# Patient Record
Sex: Male | Born: 2000 | Race: White | Hispanic: No | Marital: Single | Smoking: Never smoker
Health system: Southern US, Community
[De-identification: ages and names within clinical notes are randomized; demographics above are authoritative.]

## PROBLEM LIST (undated history)

## (undated) DIAGNOSIS — F909 Attention-deficit hyperactivity disorder, unspecified type: Secondary | ICD-10-CM

## (undated) DIAGNOSIS — T7840XA Allergy, unspecified, initial encounter: Secondary | ICD-10-CM

## (undated) DIAGNOSIS — F419 Anxiety disorder, unspecified: Secondary | ICD-10-CM

## (undated) HISTORY — DX: Attention-deficit hyperactivity disorder, unspecified type: F90.9

## (undated) HISTORY — DX: Anxiety disorder, unspecified: F41.9

## (undated) HISTORY — PX: APPENDECTOMY: SHX54

---

## 2021-02-01 ENCOUNTER — Encounter: Payer: Self-pay | Admitting: Emergency Medicine

## 2021-02-01 ENCOUNTER — Other Ambulatory Visit: Payer: Self-pay

## 2021-02-01 ENCOUNTER — Emergency Department: Payer: No Typology Code available for payment source

## 2021-02-01 ENCOUNTER — Emergency Department
Admission: EM | Admit: 2021-02-01 | Discharge: 2021-02-01 | Disposition: A | Discharge status: Home / Self Care | Payer: No Typology Code available for payment source | Attending: Emergency Medicine | Admitting: Emergency Medicine

## 2021-02-01 DIAGNOSIS — R079 Chest pain, unspecified: Secondary | ICD-10-CM | POA: Insufficient documentation

## 2021-02-01 DIAGNOSIS — H538 Other visual disturbances: Secondary | ICD-10-CM | POA: Diagnosis not present

## 2021-02-01 DIAGNOSIS — Y92410 Unspecified street and highway as the place of occurrence of the external cause: Secondary | ICD-10-CM | POA: Diagnosis not present

## 2021-02-01 DIAGNOSIS — S161XXA Strain of muscle, fascia and tendon at neck level, initial encounter: Secondary | ICD-10-CM | POA: Diagnosis not present

## 2021-02-01 DIAGNOSIS — S39012A Strain of muscle, fascia and tendon of lower back, initial encounter: Secondary | ICD-10-CM | POA: Insufficient documentation

## 2021-02-01 DIAGNOSIS — Y93I9 Activity, other involving external motion: Secondary | ICD-10-CM | POA: Insufficient documentation

## 2021-02-01 DIAGNOSIS — S299XXA Unspecified injury of thorax, initial encounter: Secondary | ICD-10-CM | POA: Diagnosis present

## 2021-02-01 DIAGNOSIS — R0781 Pleurodynia: Secondary | ICD-10-CM

## 2021-02-01 HISTORY — DX: Allergy, unspecified, initial encounter: T78.40XA

## 2021-02-01 MED ORDER — NAPROXEN 500 MG PO TABS: 500.0000 mg | ORAL_TABLET | Freq: Two times a day (BID) | ORAL | 0 refills | Status: DC
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
## 2021-02-01 NOTE — Discharge Instructions (Addendum)
Follow-up with your primary care provider or acute care if any continued problems.  Return to the emergency department if any severe worsening of your symptoms.  You can expect to be sore and stiff for approximately 4 to 5 days even with medication.  You may use ice or heat to your muscles as needed for discomfort.  Begin taking naproxen 500 mg twice daily with food.  This should help with soreness and stiffness without producing drowsiness.  The area on your face twice a day with mild soap and water and watch for any signs of infection.
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
## 2021-06-23 ENCOUNTER — Ambulatory Visit: Payer: Self-pay | Admitting: Nurse Practitioner

## 2022-04-05 ENCOUNTER — Ambulatory Visit: Payer: Self-pay | Admitting: Nurse Practitioner

## 2022-04-05 NOTE — Progress Notes (Deleted)
   There were no vitals taken for this visit.   Subjective:    Patient ID: Eugene Allen, male    DOB: [birth year], 21 y.o.   MRN: [id number]  HPI: Eugene Allen is a 21 y.o. male  No chief complaint on file.  Patient presents to clinic to establish care with new PCP.  Introduced to Publishing rights manager role and practice setting.  All questions answered.  Discussed provider/patient relationship and expectations.  Patient reports a history of ***. Patient denies a history of: Hypertension, Elevated Cholesterol, Diabetes, Thyroid problems, Depression, Anxiety, Neurological problems, and Abdominal problems.   Active Ambulatory Problems    Diagnosis Date Noted   No Active Ambulatory Problems   Resolved Ambulatory Problems    Diagnosis Date Noted   No Resolved Ambulatory Problems   Past Medical History:  Diagnosis Date   Allergies    Past Surgical History:  Procedure Laterality Date   APPENDECTOMY    No family history on file.   Review of Systems  Per HPI unless specifically indicated above     Objective:    There were no vitals taken for this visit.  Wt Readings from Last 3 Encounters:  02/01/21 184 lb (83.5 kg)    Physical Exam  No results found for this or any previous visit.    Assessment & Plan:   Problem List Items Addressed This Visit   None Visit Diagnoses     Encounter to establish care    -  Primary        Follow up plan: No follow-ups on file.

## 2022-04-05 NOTE — Patient Instructions (Signed)

## 2022-04-06 ENCOUNTER — Ambulatory Visit (INDEPENDENT_AMBULATORY_CARE_PROVIDER_SITE_OTHER): Payer: No Typology Code available for payment source | Admitting: Nurse Practitioner

## 2022-04-06 ENCOUNTER — Encounter: Payer: Self-pay | Admitting: Nurse Practitioner

## 2022-04-06 VITALS — BP 100/64 | HR 68 | Temp 97.9°F | Ht 67.5 in | Wt 188.8 lb

## 2022-04-06 DIAGNOSIS — Z7689 Persons encountering health services in other specified circumstances: Secondary | ICD-10-CM

## 2022-04-06 DIAGNOSIS — Z6829 Body mass index (BMI) 29.0-29.9, adult: Secondary | ICD-10-CM | POA: Insufficient documentation

## 2022-04-06 DIAGNOSIS — Z136 Encounter for screening for cardiovascular disorders: Secondary | ICD-10-CM

## 2022-04-06 DIAGNOSIS — Z13220 Encounter for screening for lipoid disorders: Secondary | ICD-10-CM | POA: Diagnosis not present

## 2022-04-06 DIAGNOSIS — F908 Attention-deficit hyperactivity disorder, other type: Secondary | ICD-10-CM | POA: Insufficient documentation

## 2022-04-06 DIAGNOSIS — R41840 Attention and concentration deficit: Secondary | ICD-10-CM | POA: Diagnosis not present

## 2022-04-06 NOTE — Assessment & Plan Note (Addendum)
Referral placed for ADD/ADHD testing per patient request.  Check labs outpatient: CBC, CMP, TSH.

## 2022-04-06 NOTE — Progress Notes (Signed)
New Patient Office Visit  Subjective    Patient ID: Eugene Allen, male    DOB: 15-May-2001  Age: 21 y.o. MRN: [id number]  CC:  Chief Complaint  Patient presents with   New Patient (Initial Visit)    Patient is here as a New Patient to Establish Care. Patient declines having any concerns at today's visit.     HPI Eugene Allen presents for new patient visit to establish care.  Introduced to Publishing rights manager role and practice setting.  All questions answered.  Discussed provider/patient relationship and expectations. Currently Landscape architect for [employer].   No current chronic health issues.  Had appendix removed a few years ago.  History of taking Accutane use in past.  Currently not sexually active, has not been.   He would like referral to get testing for ADHD -- [facility] preferred.  Struggled with concentration for years.      04/06/2022    4:00 PM  Depression screen PHQ 2/9  Decreased Interest 1  Down, Depressed, Hopeless 1  PHQ - 2 Score 2  Altered sleeping 2  Tired, decreased energy 1  Change in appetite 0  Feeling bad or failure about yourself  1  Trouble concentrating 2  Moving slowly or fidgety/restless 0  Suicidal thoughts 0  PHQ-9 Score 8  Difficult doing work/chores Somewhat difficult       04/06/2022    4:00 PM  GAD 7 : Generalized Anxiety Score  Nervous, Anxious, on Edge 0  Control/stop worrying 0  Worry too much - different things 3  Trouble relaxing 0  Restless 0  Easily annoyed or irritable 0  Afraid - awful might happen 0  Total GAD 7 Score 3  Anxiety Difficulty Not difficult at all     Outpatient Encounter Medications as of 04/06/2022  Medication Sig   [DISCONTINUED] naproxen (NAPROSYN) 500 MG tablet Take 1 tablet (500 mg total) by mouth 2 (two) times daily with a meal.   No facility-administered encounter medications on file as of 04/06/2022.    Past Medical History:  Diagnosis Date   Allergies     Past  Surgical History:  Procedure Laterality Date   APPENDECTOMY      Family History  Problem Relation Age of Onset   Hypertension Mother    Breast cancer Mother    Healthy Father     Social History   Socioeconomic History   Marital status: Single    Spouse name: Not on file   Number of children: Not on file   Years of education: Not on file   Highest education level: Not on file  Occupational History   Not on file  Tobacco Use   Smoking status: Never    Passive exposure: Never   Smokeless tobacco: Never  Substance and Sexual Activity   Alcohol use: Yes    Comment: occasional   Drug use: Never   Sexual activity: Not Currently  Other Topics Concern   Not on file  Social History Narrative   Not on file   Social Determinants of Health   Financial Resource Strain: Low Risk  (04/06/2022)   Overall Financial Resource Strain (CARDIA)    Difficulty of Paying Living Expenses: Not hard at all  Food Insecurity: No Food Insecurity (04/06/2022)   Hunger Vital Sign    Worried About Running Out of Food in the Last Year: Never true    Ran Out of Food in the Last Year: Never true  Transportation Needs: No Transportation Needs (04/06/2022)   PRAPARE - Administrator, Civil Service (Medical): No    Lack of Transportation (Non-Medical): No  Physical Activity: Insufficiently Active (04/06/2022)   Exercise Vital Sign    Days of Exercise per Week: 4 days    Minutes of Exercise per Session: 30 min  Stress: Stress Concern Present (04/06/2022)   [employer] of Occupational Health - Occupational Stress Questionnaire    Feeling of Stress : To some extent  Social Connections: Socially Isolated (04/06/2022)   Social Connection and Isolation Panel [NHANES]    Frequency of Communication with Friends and Family: More than three times a week    Frequency of Social Gatherings with Friends and Family: More than three times a week    Attends Religious Services: Never    Doctor, general practice or Organizations: No    Attends Banker Meetings: Never    Marital Status: Never married  Intimate Partner Violence: Not At Risk (04/06/2022)   Humiliation, Afraid, Rape, and Kick questionnaire    Fear of Current or Ex-Partner: No    Emotionally Abused: No    Physically Abused: No    Sexually Abused: No    Review of Systems  Constitutional:  Negative for chills, diaphoresis, fever and weight loss.  Respiratory:  Negative for cough, shortness of breath and wheezing.   Cardiovascular:  Negative for chest pain, palpitations, orthopnea and leg swelling.  Neurological: Negative.   Endo/Heme/Allergies: Negative.   Psychiatric/Behavioral: Negative.        Objective    BP 100/64   Pulse 68   Temp 97.9 F (36.6 C) (Oral)   Ht 5' 7.5" (1.715 m)   Wt 188 lb 12.8 oz (85.6 kg)   SpO2 98%   BMI 29.13 kg/m   Physical Exam Vitals and nursing note reviewed.  Constitutional:      General: He is awake. He is not in acute distress.    Appearance: He is well-developed. He is not ill-appearing or toxic-appearing.  HENT:     Head: Normocephalic and atraumatic.     Right Ear: Hearing, tympanic membrane, ear canal and external ear normal. No drainage.     Left Ear: Hearing, tympanic membrane, ear canal and external ear normal. No drainage.     Nose: Nose normal.     Mouth/Throat:     Mouth: Mucous membranes are moist.     Pharynx: Uvula midline.  Eyes:     General: Lids are normal.        Right eye: No discharge.        Left eye: No discharge.     Conjunctiva/sclera: Conjunctivae normal.     Pupils: Pupils are equal, round, and reactive to light.  Neck:     Thyroid: No thyromegaly.     Vascular: No carotid bruit.  Cardiovascular:     Rate and Rhythm: Normal rate and regular rhythm.     Heart sounds: Normal heart sounds, S1 normal and S2 normal. No murmur heard.    No gallop.  Pulmonary:     Effort: Pulmonary effort is normal. No accessory muscle usage or  respiratory distress.     Breath sounds: Normal breath sounds.  Abdominal:     General: Bowel sounds are normal.     Palpations: Abdomen is soft.  Musculoskeletal:        General: Normal range of motion.     Cervical back: Normal range of motion and neck  supple.     Right lower leg: No edema.     Left lower leg: No edema.  Lymphadenopathy:     Cervical: No cervical adenopathy.  Skin:    General: Skin is warm and dry.     Capillary Refill: Capillary refill takes less than 2 seconds.     Findings: No rash.  Neurological:     Mental Status: He is alert and oriented to person, place, and time.     Deep Tendon Reflexes: Reflexes are normal and symmetric.  Psychiatric:        Attention and Perception: Attention normal.        Mood and Affect: Mood normal.        Speech: Speech normal.        Behavior: Behavior normal. Behavior is cooperative.        Thought Content: Thought content normal.    Assessment & Plan:   Problem List Items Addressed This Visit       Other   BMI 29.0-29.9,adult    BMI 29.13.  Recommended eating smaller high protein, low fat meals more frequently and exercising 30 mins a day 5 times a week with a goal of 10-15lb weight loss in the next 3 months. Patient voiced their understanding and motivation to adhere to these recommendations.       Difficulty concentrating - Primary    Referral placed for ADD/ADHD testing per patient request.  Check labs outpatient: CBC, CMP, TSH.      Relevant Orders   CBC with Differential/Platelet   Comprehensive metabolic panel   TSH   Ambulatory referral to Psychiatry   Other Visit Diagnoses     Encounter for lipid screening for cardiovascular disease       Lipid panel outpatient/fasting.   Relevant Orders   Lipid Panel w/o Chol/HDL Ratio   Encounter to establish care       New patient to practice, discussed NP role and practice setting.       Return in about 1 year (around 04/07/2023) for Annual physical -- labs  outpatient in 2 weeks fasting in morning.   Marjie Skiff, NP

## 2022-04-06 NOTE — Assessment & Plan Note (Signed)
BMI 29.13.  Recommended eating smaller high protein, low fat meals more frequently and exercising 30 mins a day 5 times a week with a goal of 10-15lb weight loss in the next 3 months. Patient voiced their understanding and motivation to adhere to these recommendations.

## 2022-04-08 ENCOUNTER — Encounter: Payer: Self-pay | Admitting: Psychology
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
## 2022-06-04 ENCOUNTER — Ambulatory Visit: Payer: Self-pay | Admitting: Nurse Practitioner

## 2022-10-25 ENCOUNTER — Encounter: Payer: Medicaid Other | Attending: Psychology | Admitting: Psychology

## 2022-10-25 ENCOUNTER — Encounter: Payer: Self-pay | Admitting: Psychology

## 2022-10-25 DIAGNOSIS — R41840 Attention and concentration deficit: Secondary | ICD-10-CM | POA: Diagnosis not present

## 2022-10-25 DIAGNOSIS — Z419 Encounter for procedure for purposes other than remedying health state, unspecified: Secondary | ICD-10-CM | POA: Diagnosis not present
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
## 2022-11-02 DIAGNOSIS — R41840 Attention and concentration deficit: Secondary | ICD-10-CM

## 2022-11-02 NOTE — Progress Notes (Signed)
   Behavioral Observations: The patient appeared well-groomed and appropriately dressed. His manners were polite and appropriate to the situation. The patient's attitude towards testing was positive and his effort was good.    Neuropsychology Note  Launa Grill completed 75 minutes of neuropsychological testing with technician, Staci Acosta, BA, under the supervision of Arley Phenix, PsyD., Clinical Neuropsychologist. The patient did not appear overtly distressed by the testing session, per behavioral observation or via self-report to the technician. Rest breaks were offered.   Clinical Decision Making: In considering the patient's current level of functioning, level of presumed impairment, nature of symptoms, emotional and behavioral responses during clinical interview, level of literacy, and observed level of motivation/effort, a battery of tests was selected by Dr. Kieth Brightly during initial consultation on 10/25/2022. This was communicated to the technician. Communication between the neuropsychologist and technician was ongoing throughout the testing session and changes were made as deemed necessary based on patient performance on testing, technician observations and additional pertinent factors such as those listed above.  Tests Administered: Comprehensive Attention Battery (CAB) Continuous Performance Test (CPT)  Results: Will be included in final report   Feedback to Patient: Eugene Allen will return on 07/28/2023 for an interactive feedback session with Dr. Kieth Brightly at which time his test performances, clinical impressions and treatment recommendations will be reviewed in detail. The patient understands he can contact our office should he require our assistance before this time.  75 minutes spent face-to-face with patient administering standardized tests, 30 minutes spent scoring Radiographer, therapeutic). [CPT [id number], [id number]]  Full report to follow.

## 2022-11-24 DIAGNOSIS — Z419 Encounter for procedure for purposes other than remedying health state, unspecified: Secondary | ICD-10-CM | POA: Diagnosis not present

## 2022-12-25 DIAGNOSIS — Z419 Encounter for procedure for purposes other than remedying health state, unspecified: Secondary | ICD-10-CM | POA: Diagnosis not present

## 2023-01-24 DIAGNOSIS — Z419 Encounter for procedure for purposes other than remedying health state, unspecified: Secondary | ICD-10-CM | POA: Diagnosis not present

## 2023-02-24 DIAGNOSIS — Z419 Encounter for procedure for purposes other than remedying health state, unspecified: Secondary | ICD-10-CM | POA: Diagnosis not present

## 2023-03-27 DIAGNOSIS — Z419 Encounter for procedure for purposes other than remedying health state, unspecified: Secondary | ICD-10-CM | POA: Diagnosis not present

## 2023-04-26 DIAGNOSIS — Z419 Encounter for procedure for purposes other than remedying health state, unspecified: Secondary | ICD-10-CM | POA: Diagnosis not present

## 2023-05-27 DIAGNOSIS — Z419 Encounter for procedure for purposes other than remedying health state, unspecified: Secondary | ICD-10-CM | POA: Diagnosis not present

## 2023-06-26 DIAGNOSIS — Z419 Encounter for procedure for purposes other than remedying health state, unspecified: Secondary | ICD-10-CM | POA: Diagnosis not present

## 2023-07-06 ENCOUNTER — Encounter: Payer: Medicaid Other | Attending: Psychology | Admitting: Psychology

## 2023-07-06 ENCOUNTER — Encounter: Payer: Self-pay | Admitting: Psychology

## 2023-07-06 DIAGNOSIS — F908 Attention-deficit hyperactivity disorder, other type: Secondary | ICD-10-CM | POA: Diagnosis not present
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
## 2023-07-27 DIAGNOSIS — Z419 Encounter for procedure for purposes other than remedying health state, unspecified: Secondary | ICD-10-CM | POA: Diagnosis not present

## 2023-07-28 ENCOUNTER — Encounter: Payer: Self-pay | Admitting: Psychology

## 2023-07-28 ENCOUNTER — Encounter: Payer: Medicaid Other | Attending: Psychology | Admitting: Psychology

## 2023-07-28 DIAGNOSIS — F908 Attention-deficit hyperactivity disorder, other type: Secondary | ICD-10-CM
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
## 2023-07-30 NOTE — Patient Instructions (Signed)
 Healthy Eating, Adult Healthy eating may help you get and keep a healthy body weight, reduce the risk of chronic disease, and live a long and productive life. It is important to follow a healthy eating pattern. Your nutritional and calorie needs should be met mainly by different nutrient-rich foods. What are tips for following this plan? Reading food labels Read labels and choose the following: Reduced or low sodium products. Juices with 100% fruit juice. Foods with low saturated fats (<3 g per serving) and high polyunsaturated and monounsaturated fats. Foods with whole grains, such as whole wheat, cracked wheat, brown rice, and wild rice. Whole grains that are fortified with folic acid . This is recommended for females who are pregnant or who want to become pregnant. Read labels and do not eat or drink the following: Foods or drinks with added sugars. These include foods that contain brown sugar, corn sweetener, corn syrup, dextrose , fructose, glucose, high-fructose corn syrup, honey, invert sugar, lactose, malt syrup, maltose, molasses, raw sugar, sucrose, trehalose, or turbinado sugar. Limit your intake of added sugars to less than 10% of your total daily calories. Do not eat more than the following amounts of added sugar per day: 6 teaspoons (25 g) for females. 9 teaspoons (38 g) for males. Foods that contain processed or refined starches and grains. Refined grain products, such as white flour, degermed cornmeal, white bread, and white rice. Shopping Choose nutrient-rich snacks, such as vegetables, whole fruits, and nuts. Avoid high-calorie and high-sugar snacks, such as potato chips, fruit snacks, and candy. Use oil-based dressings and spreads on foods instead of solid fats such as butter, margarine, sour cream, or cream cheese. Limit pre-made sauces, mixes, and instant products such as flavored rice, instant noodles, and ready-made pasta. Try more plant-protein sources, such as tofu,  tempeh, black beans, edamame, lentils, nuts, and seeds. Explore eating plans such as the Mediterranean diet or vegetarian diet. Try heart-healthy dips made with beans and healthy fats like hummus and guacamole. Vegetables go great with these. Cooking Use oil to saut or stir-fry foods instead of solid fats such as butter, margarine, or lard. Try baking, boiling, grilling, or broiling instead of frying. Remove the fatty part of meats before cooking. Steam vegetables in water  or broth. Meal planning  At meals, imagine dividing your plate into fourths: One-half of your plate is fruits and vegetables. One-fourth of your plate is whole grains. One-fourth of your plate is protein, especially lean meats, poultry, eggs, tofu, beans, or nuts. Include low-fat dairy as part of your daily diet. Lifestyle Choose healthy options in all settings, including home, work, school, restaurants, or stores. Prepare your food safely: Wash your hands after handling raw meats. Where you prepare food, keep surfaces clean by regularly washing with hot, soapy water . Keep raw meats separate from ready-to-eat foods, such as fruits and vegetables. Cook seafood, meat, poultry, and eggs to the recommended temperature. Get a food thermometer. Store foods at safe temperatures. In general: Keep cold foods at 34F (4.4C) or below. Keep hot foods at 134F (60C) or above. Keep your freezer at [facility] (-17.8C) or below. Foods are not safe to eat if they have been between the temperatures of 40-134F (4.4-60C) for more than 2 hours. What foods should I eat? Fruits Aim to eat 1-2 cups of fresh, canned (in natural juice), or frozen fruits each day. One cup of fruit equals 1 small apple, 1 large banana, 8 large strawberries, 1 cup (237 g) canned fruit,  cup (82 g) dried fruit,  or 1 cup (240 mL) 100% juice. Vegetables Aim to eat 2-4 cups of fresh and frozen vegetables each day, including different varieties and colors. One cup  of vegetables equals 1 cup (91 g) broccoli or cauliflower florets, 2 medium carrots, 2 cups (150 g) raw, leafy greens, 1 large tomato, 1 large bell pepper, 1 large sweet potato, or 1 medium white potato. Grains Aim to eat 5-10 ounce-equivalents of whole grains each day. Examples of 1 ounce-equivalent of grains include 1 slice of bread, 1 cup (40 g) ready-to-eat cereal, 3 cups (24 g) popcorn, or  cup (93 g) cooked rice. Meats and other proteins Try to eat 5-7 ounce-equivalents of protein each day. Examples of 1 ounce-equivalent of protein include 1 egg,  oz nuts (12 almonds, 24 pistachios, or 7 walnut halves), 1/4 cup (90 g) cooked beans, 6 tablespoons (90 g) hummus or 1 tablespoon (16 g) peanut butter. A cut of meat or fish that is the size of a deck of cards is about 3-4 ounce-equivalents (85 g). Of the protein you eat each week, try to have at least 8 sounce (227 g) of seafood. This is about 2 servings per week. This includes salmon, trout, herring, sardines, and anchovies. Dairy Aim to eat 3 cup-equivalents of fat-free or low-fat dairy each day. Examples of 1 cup-equivalent of dairy include 1 cup (240 mL) milk, 8 ounces (250 g) yogurt, 1 ounces (44 g) natural cheese, or 1 cup (240 mL) fortified soy milk. Fats and oils Aim for about 5 teaspoons (21 g) of fats and oils per day. Choose monounsaturated fats, such as canola and olive oils, mayonnaise made with olive oil or avocado oil, avocados, peanut butter, and most nuts, or polyunsaturated fats, such as sunflower, corn, and soybean oils, walnuts, pine nuts, sesame seeds, sunflower seeds, and flaxseed. Beverages Aim for 6 eight-ounce glasses of water  per day. Limit coffee to 3-5 eight-ounce cups per day. Limit caffeinated beverages that have added calories, such as soda and energy drinks. If you drink alcohol: Limit how much you have to: 0-1 drink a day if you are male. 0-2 drinks a day if you are male. Know how much alcohol is in your drink.  In the [place] one drink is one 12 oz bottle of beer (355 mL), one 5 oz glass of wine (148 mL), or one 1 oz glass of hard liquor (44 mL). Seasoning and other foods Try not to add too much salt to your food. Try using herbs and spices instead of salt. Try not to add sugar to food. This information is based on [place] nutrition guidelines. To learn more, visit [employer]. Exact amounts may vary. You may need different amounts. This information is not intended to replace advice given to you by your health care provider. Make sure you discuss any questions you have with your health care provider. Document Revised: 04/12/2022 Document Reviewed: 04/12/2022 Elsevier Patient Education  2024 [employer].

## 2023-08-05 ENCOUNTER — Ambulatory Visit (INDEPENDENT_AMBULATORY_CARE_PROVIDER_SITE_OTHER): Payer: Medicaid Other | Admitting: Nurse Practitioner

## 2023-08-05 VITALS — BP 105/70 | HR 78 | Temp 97.5°F | Ht 67.0 in | Wt 206.4 lb

## 2023-08-05 DIAGNOSIS — Z79899 Other long term (current) drug therapy: Secondary | ICD-10-CM | POA: Insufficient documentation

## 2023-08-05 DIAGNOSIS — F908 Attention-deficit hyperactivity disorder, other type: Secondary | ICD-10-CM

## 2023-08-05 MED ORDER — AMPHETAMINE-DEXTROAMPHET ER 10 MG PO CP24: 10.0000 mg | ORAL_CAPSULE | Freq: Every day | ORAL | 0 refills | Status: DC

## 2023-08-05 NOTE — Assessment & Plan Note (Signed)
 Adderall -- controlled substance agreement and UDS today.

## 2023-08-05 NOTE — Progress Notes (Signed)
 BP 105/70   Pulse 78   Temp (!) 97.5 F (36.4 C) (Oral)   Ht 5' 7 (1.702 m)   Wt 206 lb 6.4 oz (93.6 kg)   SpO2 98%   BMI 32.33 kg/m    Subjective:    Patient ID: Eugene Allen, male    DOB: 11/07/2000, 23 y.o.   MRN: [id number]  HPI: Eugene Allen is a 23 y.o. male  Chief Complaint  Patient presents with   ADHD    Patient states he was recently diagnosed with ADHD by psychiatry, states he would like to discuss starting treatment.    ADHD FOLLOW UP Saw psychology 07/06/23 and 07/28/23, has been following with them.  Started in April 2024.  Recently diagnosed with adult residual attention deficit disorder.  He does notice his symptoms start more mid morning and last all day.  Has had symptoms since childhood. ADHD status: new diagnosis Controlled substance contract:  obtained today Previous psychiatry evaluation: yes psychology Previous medications: no  Work/school performance:  fair Difficulty sustaining attention/completing tasks: yes Distracted by extraneous stimuli: yes Does not listen when spoken to: yes  Fidgets with hands or feet: yes Unable to stay in seat: no Blurts out/interrupts others: no ADHD Medication Side Effects: no current medication    Decreased appetite: no    Headache: no    Sleeping disturbance pattern: no    Irritability: no    Rebound effects (worse than baseline) off medication: no    Anxiousness: no    Dizziness: no    Tics: no    08/05/2023   10:24 AM 04/06/2022    4:00 PM  Depression screen PHQ 2/9  Decreased Interest 0 1  Down, Depressed, Hopeless 1 1  PHQ - 2 Score 1 2  Altered sleeping 2 2  Tired, decreased energy 2 1  Change in appetite 2 0  Feeling bad or failure about yourself  1 1  Trouble concentrating 2 2  Moving slowly or fidgety/restless 0 0  Suicidal thoughts 0 0  PHQ-9 Score 10 8  Difficult doing work/chores Somewhat difficult Somewhat difficult       08/05/2023   10:24 AM 04/06/2022    4:00 PM  GAD 7 :  Generalized Anxiety Score  Nervous, Anxious, on Edge 1 0  Control/stop worrying 1 0  Worry too much - different things 1 3  Trouble relaxing 0 0  Restless 0 0  Easily annoyed or irritable 0 0  Afraid - awful might happen 0 0  Total GAD 7 Score 3 3  Anxiety Difficulty Somewhat difficult Not difficult at all    Relevant past medical, surgical, family and social history reviewed and updated as indicated. Interim medical history since our last visit reviewed. Allergies and medications reviewed and updated.  Review of Systems  Constitutional:  Negative for activity change, diaphoresis, fatigue and fever.  Respiratory:  Negative for cough, chest tightness, shortness of breath and wheezing.   Cardiovascular:  Negative for chest pain, palpitations and leg swelling.  Gastrointestinal: Negative.   Neurological: Negative.   Psychiatric/Behavioral:  Positive for decreased concentration. Negative for self-injury, sleep disturbance and suicidal ideas. The patient is nervous/anxious.    Per HPI unless specifically indicated above     Objective:    BP 105/70   Pulse 78   Temp (!) 97.5 F (36.4 C) (Oral)   Ht 5' 7 (1.702 m)   Wt 206 lb 6.4 oz (93.6 kg)   SpO2 98%   BMI 32.33  kg/m   Wt Readings from Last 3 Encounters:  08/05/23 206 lb 6.4 oz (93.6 kg)  04/06/22 188 lb 12.8 oz (85.6 kg)  02/01/21 184 lb (83.5 kg)    Physical Exam Vitals and nursing note reviewed.  Constitutional:      General: He is awake. He is not in acute distress.    Appearance: He is well-developed and well-groomed. He is obese. He is not ill-appearing or toxic-appearing.  HENT:     Head: Normocephalic.     Right Ear: Hearing and external ear normal.     Left Ear: Hearing and external ear normal.  Eyes:     General: Lids are normal.     Extraocular Movements: Extraocular movements intact.     Conjunctiva/sclera: Conjunctivae normal.  Neck:     Thyroid : No thyromegaly.     Vascular: No carotid bruit.   Cardiovascular:     Rate and Rhythm: Normal rate and regular rhythm.     Heart sounds: Normal heart sounds. No murmur heard.    No gallop.  Pulmonary:     Effort: No accessory muscle usage or respiratory distress.     Breath sounds: Normal breath sounds.  Abdominal:     General: Bowel sounds are normal. There is no distension.     Palpations: Abdomen is soft.     Tenderness: There is no abdominal tenderness.  Musculoskeletal:     Cervical back: Full passive range of motion without pain.     Right lower leg: No edema.     Left lower leg: No edema.  Lymphadenopathy:     Cervical: No cervical adenopathy.  Skin:    General: Skin is warm.     Capillary Refill: Capillary refill takes less than 2 seconds.  Neurological:     Mental Status: He is alert and oriented to person, place, and time.     Deep Tendon Reflexes: Reflexes are normal and symmetric.     Reflex Scores:      Brachioradialis reflexes are 2+ on the right side and 2+ on the left side.      Patellar reflexes are 2+ on the right side and 2+ on the left side. Psychiatric:        Attention and Perception: Attention normal.        Mood and Affect: Mood normal.        Speech: Speech normal.        Behavior: Behavior normal. Behavior is cooperative.        Thought Content: Thought content normal.    No results found for this or any previous visit.    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 Discussed with him various options for treatment, including no treatment, Wellbutrin, or stimulant family like Ritalin/Adderall/Vyvanse.  Educated him on all medications and side effects.  We discussed office rules for controlled substances: every 3 month visits, UDS annually, controlled substance agreement.  He is agreeable to these.  UDS today and controlled substance agreement  signed.  Will start out with trial of Adderall XL 10 MG since symptoms often last throughout day and start later in morning.  Educated on this.  Plan for return in 30 days.      Controlled substance agreement signed   Signed on 08/05/2023 with patient and reviewed.  High risk medication use   Adderall -- controlled substance agreement and UDS today.      Relevant Orders   [id number] 11+Oxyco+Alc+Crt-Bund     Follow up plan: Return in about 1 month (around 09/05/2023) for ADHD -- started Adderall XL.

## 2023-08-05 NOTE — Assessment & Plan Note (Signed)
 New diagnosis by psychology January 2025. Discussed with him various options for treatment, including no treatment, Wellbutrin, or stimulant family like Ritalin/Adderall/Vyvanse.  Educated him on all medications and side effects.  We discussed office rules for controlled substances: every 3 month visits, UDS annually, controlled substance agreement.  He is agreeable to these.  UDS today and controlled substance agreement signed.  Will start out with trial of Adderall XL 10 MG since symptoms often last throughout day and start later in morning.  Educated on this.  Plan for return in 30 days.

## 2023-08-05 NOTE — Assessment & Plan Note (Signed)
 Signed on 08/05/2023 with patient and reviewed.

## 2023-08-08 LAB — DRUG SCREEN 764883 11+OXYCO+ALC+CRT-BUND
Amphetamines, Urine: NEGATIVE ng/mL
BENZODIAZ UR QL: NEGATIVE ng/mL
Barbiturate: NEGATIVE ng/mL
Cannabinoid Quant, Ur: NEGATIVE ng/mL
Cocaine (Metabolite): NEGATIVE ng/mL
Creatinine: 227.5 mg/dL (ref 20.0–300.0)
Ethanol: NEGATIVE %
Meperidine: NEGATIVE ng/mL
Methadone Screen, Urine: NEGATIVE ng/mL
OPIATE SCREEN URINE: NEGATIVE ng/mL
Oxycodone/Oxymorphone, Urine: NEGATIVE ng/mL
Phencyclidine: NEGATIVE ng/mL
Propoxyphene: NEGATIVE ng/mL
Tramadol: NEGATIVE ng/mL
pH, Urine: 5.6 (ref 4.5–8.9)

## 2023-08-17 ENCOUNTER — Ambulatory Visit: Payer: No Typology Code available for payment source | Admitting: Nurse Practitioner

## 2023-08-27 DIAGNOSIS — Z419 Encounter for procedure for purposes other than remedying health state, unspecified: Secondary | ICD-10-CM | POA: Diagnosis not present

## 2023-09-03 NOTE — Patient Instructions (Signed)

## 2023-09-05 ENCOUNTER — Encounter: Payer: Self-pay | Admitting: Nurse Practitioner

## 2023-09-05 ENCOUNTER — Telehealth: Payer: Self-pay | Admitting: Nurse Practitioner

## 2023-09-05 DIAGNOSIS — F908 Attention-deficit hyperactivity disorder, other type: Secondary | ICD-10-CM

## 2023-09-05 MED ORDER — AMPHETAMINE-DEXTROAMPHET ER 15 MG PO CP24: 15.0000 mg | ORAL_CAPSULE | ORAL | 0 refills | Status: DC

## 2023-09-05 NOTE — Assessment & Plan Note (Signed)
 Diagnosed by psychology January 2025. Is tolerating Adderall and seeing some difference.  Will increase to 15 MG XL dosing daily.  Educated him on medication and side effects.  We discussed office rules for controlled substances: every 3 month visits, UDS annually, controlled substance agreement.  He is agreeable to these.  UDS due next 08/04/24 and controlled substance agreement signed.  Plan for return in 30 days.

## 2023-09-05 NOTE — Progress Notes (Signed)
 Appointment has been made

## 2023-09-05 NOTE — Progress Notes (Signed)
 There were no vitals taken for this visit.   Subjective:    Patient ID: Eugene Allen, male    DOB: [birth year], 23 y.o.   MRN: [id number]  HPI: Eugene Allen is a 23 y.o. male  Chief Complaint  Patient presents with   ADHD   Virtual Visit via Video Note  I connected with Eugene Allen on 09/05/23 at 10:20 AM EST by a video enabled telemedicine application and verified that I am speaking with the correct person using two identifiers.  Location: Patient: home Provider: work   I discussed the limitations of evaluation and management by telemedicine and the availability of in person appointments. The patient expressed understanding and agreed to proceed.  I discussed the assessment and treatment plan with the patient. The patient was provided an opportunity to ask questions and all were answered. The patient agreed with the plan and demonstrated an understanding of the instructions.   The patient was advised to call back or seek an in-person evaluation if the symptoms worsen or if the condition fails to improve as anticipated.  I provided 25 minutes of non-face-to-face time during this encounter.   Yarelis Ambrosino T Tomica Arseneault, NP   ADHD FOLLOW UP Saw psychology 07/06/23 and 07/28/23, had been following with them.  They diagnosed him with adult residual attention deficit disorder.  We started him on Adderall XR 10 MG on 08/05/23, which he reports is making a difference, but does wear off later in the day.  Does notice improved focus and listening.  Denies ADR.  Has had symptoms since childhood. ADHD status: improving Controlled substance contract: Up To Date Previous psychiatry evaluation: yes psychology Previous medications: no  Work/school performance:  fair Difficulty sustaining attention/completing tasks: no, improved Distracted by extraneous stimuli: no, improved Does not listen when spoken to: no, improved Fidgets with hands or feet: yes Unable to stay in seat: no Blurts  out/interrupts others: no ADHD Medication Side Effects:     Decreased appetite: yes, mild    Headache: no    Sleeping disturbance pattern: no    Irritability: no    Rebound effects (worse than baseline) off medication: no    Anxiousness: no    Dizziness: no    Tics: no    08/05/2023   10:24 AM 04/06/2022    4:00 PM  Depression screen PHQ 2/9  Decreased Interest 0 1  Down, Depressed, Hopeless 1 1  PHQ - 2 Score 1 2  Altered sleeping 2 2  Tired, decreased energy 2 1  Change in appetite 2 0  Feeling bad or failure about yourself  1 1  Trouble concentrating 2 2  Moving slowly or fidgety/restless 0 0  Suicidal thoughts 0 0  PHQ-9 Score 10 8  Difficult doing work/chores Somewhat difficult Somewhat difficult       08/05/2023   10:24 AM 04/06/2022    4:00 PM  GAD 7 : Generalized Anxiety Score  Nervous, Anxious, on Edge 1 0  Control/stop worrying 1 0  Worry too much - different things 1 3  Trouble relaxing 0 0  Restless 0 0  Easily annoyed or irritable 0 0  Afraid - awful might happen 0 0  Total GAD 7 Score 3 3  Anxiety Difficulty Somewhat difficult Not difficult at all    Relevant past medical, surgical, family and social history reviewed and updated as indicated. Interim medical history since our last visit reviewed. Allergies and medications reviewed and updated.  Review of Systems  Constitutional:  Negative  for activity change, diaphoresis, fatigue and fever.  Respiratory:  Negative for cough, chest tightness, shortness of breath and wheezing.   Cardiovascular:  Negative for chest pain, palpitations and leg swelling.  Gastrointestinal: Negative.   Neurological: Negative.   Psychiatric/Behavioral:  Positive for decreased concentration. Negative for self-injury, sleep disturbance and suicidal ideas. The patient is nervous/anxious.    Per HPI unless specifically indicated above     Objective:    There were no vitals taken for this visit.  Wt Readings from Last 3  Encounters:  08/05/23 206 lb 6.4 oz (93.6 kg)  04/06/22 188 lb 12.8 oz (85.6 kg)  02/01/21 184 lb (83.5 kg)    Physical Exam Vitals and nursing note reviewed.  Constitutional:      General: He is awake. He is not in acute distress.    Appearance: He is well-developed. He is not ill-appearing or toxic-appearing.  HENT:     Head: Normocephalic.     Right Ear: Hearing normal. No drainage.     Left Ear: Hearing normal. No drainage.  Eyes:     General: Lids are normal.        Right eye: No discharge.        Left eye: No discharge.     Conjunctiva/sclera: Conjunctivae normal.  Pulmonary:     Effort: Pulmonary effort is normal. No accessory muscle usage or respiratory distress.  Musculoskeletal:     Cervical back: Normal range of motion.  Neurological:     Mental Status: He is alert and oriented to person, place, and time.  Psychiatric:        Mood and Affect: Mood normal.        Behavior: Behavior normal. Behavior is cooperative.        Thought Content: Thought content normal.        Judgment: Judgment normal.    Results for orders placed or performed in visit on 08/05/23  [id number] 11+Oxyco+Alc+Crt-Bund   Collection Time: 08/05/23 11:10 AM  Result Value Ref Range   Ethanol Negative Cutoff=0.020 %   Amphetamines, Urine Negative Cutoff=1000 ng/mL   Barbiturate Negative Cutoff=200 ng/mL   BENZODIAZ UR QL Negative Cutoff=200 ng/mL   Cannabinoid Quant, Ur Negative Cutoff=50 ng/mL   Cocaine (Metabolite) Negative Cutoff=300 ng/mL   OPIATE SCREEN URINE Negative Cutoff=300 ng/mL   Oxycodone/Oxymorphone, Urine Negative Cutoff=300 ng/mL   Phencyclidine Negative Cutoff=25 ng/mL   Methadone Screen, Urine Negative Cutoff=300 ng/mL   Propoxyphene Negative Cutoff=300 ng/mL   Meperidine Negative Cutoff=200 ng/mL   Tramadol Negative Cutoff=200 ng/mL   Creatinine 227.5 20.0 - 300.0 mg/dL   pH, Urine 5.6 4.5 - 8.9      Assessment & Plan:   Problem List Items Addressed This Visit        Other   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 Is tolerating Adderall and seeing some difference.  Will increase to 15 MG XL dosing daily.  Educated him on medication and side effects.  We discussed office rules for controlled substances: every 3 month visits, UDS annually, controlled substance agreement.  He is agreeable to these.  UDS due next 08/04/24 and controlled substance agreement signed.  Plan for return in 30 days.        Follow up plan: Return in about 4 weeks (around 10/03/2023) for ADHD.

## 2023-09-24 DIAGNOSIS — Z419 Encounter for procedure for purposes other than remedying health state, unspecified: Secondary | ICD-10-CM | POA: Diagnosis not present

## 2023-10-01 NOTE — Patient Instructions (Signed)
 Living With Attention Deficit Hyperactivity Disorder If you have been diagnosed with attention deficit hyperactivity disorder (ADHD), you may be relieved that you now know why you have felt or behaved a certain way. Still, you may feel overwhelmed about the treatment ahead. You may also wonder how to get the support you need and how to deal with the condition day-to-day. With treatment and support, you can live with ADHD and manage your symptoms. How to manage lifestyle changes Managing lifestyle changes can be challenging. Seeking support from your healthcare provider, therapist, family, and friends can be helpful. How to recognize changes in your condition The following signs may mean that your treatment is working well and your condition is improving: Consistently being on time for appointments. Being more organized at home and work. Other people noticing improvements in your behavior. Achieving goals that you set for yourself. Thinking more clearly. The following signs may mean that your treatment is not working very well: Feeling impatience or more confusion. Missing, forgetting, or being late for appointments. An increasing sense of disorganization and messiness. More difficulty in reaching goals that you set for yourself. Loved ones becoming angry or frustrated with you. Follow these instructions at home: Medicines Take over-the-counter and prescription medicines only as told by your health care provider. Check with your health care provider before taking any new medicines. General instructions Create structure and an organized atmosphere at home. For example: Make a list of tasks, then rank them from most important to least important. Work on one task at a time until your listed tasks are done. Make a daily schedule and follow it consistently every day. Use an appointment calendar, and check it 2-3 times a day to keep on track. Keep it with you when you leave the house. Create  spaces where you keep certain things, and always put things back in their places after you use them. Keep all follow-up visits. Your health care provider will need to monitor your condition and adjust your treatment over time. Where to find support Talking to others  Keep emotion out of important discussions and speak in a calm, logical way. Listen closely and patiently to your loved ones. Try to understand their point of view, and try to avoid getting defensive. Take responsibility for the consequences of your actions. Ask that others do not take your behaviors personally. Aim to solve problems as they come up, and express your feelings instead of bottling them up. Talk openly about what you need from your loved ones and how they can support you. Consider going to family therapy sessions or having your family meet with a specialist who deals with ADHD-related behavior problems. Finances Not all insurance plans cover mental health care, so it is important to check with your insurance carrier. If paying for co-pays or counseling services is a problem, search for a local or county mental health care center. Public mental health care services may be offered there at a low cost or no cost when you are not able to see a private health care provider. If you are taking medicine for ADHD, you may be able to get the generic form, which may be less expensive than brand-name medicine. Some makers of prescription medicines also offer help to patients who cannot afford the medicines that they need. Therapy and support groups Talking with a mental health care provider and participating in support groups can help to improve your quality of life, daily functioning, and overall symptoms. Questions to ask your health  care provider: What are the risks and benefits of taking medicines? Would I benefit from therapy? How often should I follow up with a health care provider? Where to find more information Learn more  about ADHD from: Children and Adults with Attention Deficit Hyperactivity Disorder: [employer] of Mental Health: BloggerCourse.com Centers for Disease Control and Prevention: TonerPromos.no Contact a health care provider if: You have side effects from your medicines, such as: Repeated muscle twitches, coughing, or speech outbursts. Sleep problems. Loss of appetite. Dizziness. Unusually fast heartbeat. Stomach pains. Headaches. You have new or worsening behavior problems. You are struggling with anxiety, depression, or substance abuse. Get help right away if: You have a severe reaction to a medicine. These symptoms may be an emergency. Get help right away. Call 911. Do not wait to see if the symptoms will go away. Do not drive yourself to the hospital. Take one of these steps if you feel like you may hurt yourself or others, or have thoughts about taking your own life: Go to your nearest emergency room. Call 911. Call the National Suicide Prevention Lifeline at (989) 115-0802 or 988. This is open 24 hours a day. Text the Crisis Text Line at 802-008-3988. Summary With treatment and support, you can live with ADHD and manage your symptoms. Consider taking part in family therapy or self-help groups with family members or friends. When you talk with friends and family about your ADHD, be patient and communicate openly. Keep all follow-up visits. Your health care provider will need to monitor your condition and adjust your treatment over time. This information is not intended to replace advice given to you by your health care provider. Make sure you discuss any questions you have with your health care provider. Document Revised: 10/30/2021 Document Reviewed: 10/30/2021 Elsevier Patient Education  2024 [employer].

## 2023-10-03 ENCOUNTER — Encounter: Payer: Self-pay | Admitting: Nurse Practitioner

## 2023-10-03 ENCOUNTER — Telehealth (INDEPENDENT_AMBULATORY_CARE_PROVIDER_SITE_OTHER): Payer: Medicaid Other | Admitting: Nurse Practitioner

## 2023-10-03 DIAGNOSIS — F908 Attention-deficit hyperactivity disorder, other type: Secondary | ICD-10-CM | POA: Diagnosis not present

## 2023-10-03 MED ORDER — AMPHETAMINE-DEXTROAMPHET ER 15 MG PO CP24: 15.0000 mg | ORAL_CAPSULE | ORAL | 0 refills | Status: DC

## 2023-10-03 NOTE — Assessment & Plan Note (Signed)
 Diagnosed by psychology January 2025. Is tolerating Adderall and noticing improvement.  Continue Adderall 15 MG XL dosing daily.  Educated him on medication and side effects.  Recommend he take early, first thing, in morning for best effect vs later in the day.  We discussed office rules for controlled substances: every 3 month visits, UDS annually, controlled substance agreement.  He is agreeable to these.  UDS due next 08/04/24 and controlled substance agreement signed.  Plan for return in 3 months.  Sooner if needed.

## 2023-10-03 NOTE — Progress Notes (Signed)
 There were no vitals taken for this visit.   Subjective:    Patient ID: Eugene Allen, male    DOB: 08-24-2000, 23 y.o.   MRN: [id number]  HPI: Eugene Allen is a 23 y.o. male  Chief Complaint  Patient presents with   ADHD    Patient states that he has been having ups and downs with the medication, sometimes it helps to much and then sometimes not at all.    Virtual Visit via Video Note  I connected with Eugene Allen on 10/03/23 at 10:40 AM EDT by a video enabled telemedicine application and verified that I am speaking with the correct person using two identifiers.  Location: Patient: home Provider: work   I discussed the limitations of evaluation and management by telemedicine and the availability of in person appointments. The patient expressed understanding and agreed to proceed.  I discussed the assessment and treatment plan with the patient. The patient was provided an opportunity to ask questions and all were answered. The patient agreed with the plan and demonstrated an understanding of the instructions.   The patient was advised to call back or seek an in-person evaluation if the symptoms worsen or if the condition fails to improve as anticipated.  I provided 25 minutes of non-face-to-face time during this encounter.   Marjie Skiff, NP   ADHD FOLLOW UP Saw psychology 07/06/23 and 07/28/23, had been following with them.  They diagnosed him with adult residual attention deficit disorder.  We started him on Adderall XR 10 MG on 08/05/23.  Increased dose to 15 MG XR dosing last visit, 09/05/23 -- he feels this is working better.  If takes early in the morning it works better than if he takes it later in Editor, commissioning day.  Does notice improved focus and listening.  Denies ADR.   ADHD status: improving Controlled substance contract: Up To Date Previous psychiatry evaluation: yes psychology Previous medications: no  Work/school performance:  fair Difficulty sustaining  attention/completing tasks: improved Distracted by extraneous stimuli: improved Does not listen when spoken to:  improved Fidgets with hands or feet: yes Unable to stay in seat: no Blurts out/interrupts others: no ADHD Medication Side Effects:     Decreased appetite: yes, mild    Headache: no    Sleeping disturbance pattern: no    Irritability: no    Rebound effects (worse than baseline) off medication: no    Anxiousness: no    Dizziness: no    Tics: no    08/05/2023   10:24 AM 04/06/2022    4:00 PM  Depression screen PHQ 2/9  Decreased Interest 0 1  Down, Depressed, Hopeless 1 1  PHQ - 2 Score 1 2  Altered sleeping 2 2  Tired, decreased energy 2 1  Change in appetite 2 0  Feeling bad or failure about yourself  1 1  Trouble concentrating 2 2  Moving slowly or fidgety/restless 0 0  Suicidal thoughts 0 0  PHQ-9 Score 10 8  Difficult doing work/chores Somewhat difficult Somewhat difficult       08/05/2023   10:24 AM 04/06/2022    4:00 PM  GAD 7 : Generalized Anxiety Score  Nervous, Anxious, on Edge 1 0  Control/stop worrying 1 0  Worry too much - different things 1 3  Trouble relaxing 0 0  Restless 0 0  Easily annoyed or irritable 0 0  Afraid - awful might happen 0 0  Total GAD 7 Score 3 3  Anxiety Difficulty  Somewhat difficult Not difficult at all   Relevant past medical, surgical, family and social history reviewed and updated as indicated. Interim medical history since our last visit reviewed. Allergies and medications reviewed and updated.  Review of Systems  Constitutional:  Negative for activity change, diaphoresis, fatigue and fever.  Respiratory:  Negative for cough, chest tightness, shortness of breath and wheezing.   Cardiovascular:  Negative for chest pain, palpitations and leg swelling.  Gastrointestinal: Negative.   Neurological: Negative.   Psychiatric/Behavioral:  Positive for decreased concentration. Negative for self-injury, sleep disturbance and  suicidal ideas. The patient is nervous/anxious.    Per HPI unless specifically indicated above     Objective:    There were no vitals taken for this visit.  Wt Readings from Last 3 Encounters:  08/05/23 206 lb 6.4 oz (93.6 kg)  04/06/22 188 lb 12.8 oz (85.6 kg)  02/01/21 184 lb (83.5 kg)    Physical Exam Vitals and nursing note reviewed.  Constitutional:      General: He is awake. He is not in acute distress.    Appearance: He is well-developed. He is not ill-appearing or toxic-appearing.  HENT:     Head: Normocephalic.     Right Ear: Hearing normal. No drainage.     Left Ear: Hearing normal. No drainage.  Eyes:     General: Lids are normal.        Right eye: No discharge.        Left eye: No discharge.     Conjunctiva/sclera: Conjunctivae normal.  Pulmonary:     Effort: Pulmonary effort is normal. No accessory muscle usage or respiratory distress.  Musculoskeletal:     Cervical back: Normal range of motion.  Neurological:     Mental Status: He is alert and oriented to person, place, and time.  Psychiatric:        Mood and Affect: Mood normal.        Behavior: Behavior normal. Behavior is cooperative.        Thought Content: Thought content normal.        Judgment: Judgment normal.    Results for orders placed or performed in visit on 08/05/23  [id number] 11+Oxyco+Alc+Crt-Bund   Collection Time: 08/05/23 11:10 AM  Result Value Ref Range   Ethanol Negative Cutoff=0.020 %   Amphetamines, Urine Negative Cutoff=1000 ng/mL   Barbiturate Negative Cutoff=200 ng/mL   BENZODIAZ UR QL Negative Cutoff=200 ng/mL   Cannabinoid Quant, Ur Negative Cutoff=50 ng/mL   Cocaine (Metabolite) Negative Cutoff=300 ng/mL   OPIATE SCREEN URINE Negative Cutoff=300 ng/mL   Oxycodone/Oxymorphone, Urine Negative Cutoff=300 ng/mL   Phencyclidine Negative Cutoff=25 ng/mL   Methadone Screen, Urine Negative Cutoff=300 ng/mL   Propoxyphene Negative Cutoff=300 ng/mL   Meperidine Negative Cutoff=200  ng/mL   Tramadol Negative Cutoff=200 ng/mL   Creatinine 227.5 20.0 - 300.0 mg/dL   pH, Urine 5.6 4.5 - 8.9      Assessment & Plan:   Problem List Items Addressed This Visit       Other   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 Is tolerating Adderall and noticing improvement.  Continue Adderall 15 MG XL dosing daily.  Educated him on medication and side effects.  Recommend he take early, first thing, in morning for best effect vs later in the day.  We discussed office rules for controlled substances: every 3 month visits, UDS annually, controlled substance agreement.  He is agreeable to these.  UDS due next 08/04/24 and controlled substance agreement signed.  Plan for return in 3 months.  Sooner if needed.         Follow up plan: Return in about 3 months (around 01/03/2024) for ADD.

## 2023-10-04 NOTE — Progress Notes (Signed)
 Appointment has been made

## 2023-11-05 DIAGNOSIS — Z419 Encounter for procedure for purposes other than remedying health state, unspecified: Secondary | ICD-10-CM | POA: Diagnosis not present

## 2023-12-05 DIAGNOSIS — Z419 Encounter for procedure for purposes other than remedying health state, unspecified: Secondary | ICD-10-CM | POA: Diagnosis not present

## 2024-01-01 NOTE — Patient Instructions (Signed)

## 2024-01-04 ENCOUNTER — Encounter: Payer: Self-pay | Admitting: Nurse Practitioner

## 2024-01-04 ENCOUNTER — Telehealth (INDEPENDENT_AMBULATORY_CARE_PROVIDER_SITE_OTHER): Admitting: Nurse Practitioner

## 2024-01-04 DIAGNOSIS — F908 Attention-deficit hyperactivity disorder, other type: Secondary | ICD-10-CM

## 2024-01-04 MED ORDER — AMPHETAMINE-DEXTROAMPHET ER 30 MG PO CP24: 30.0000 mg | ORAL_CAPSULE | ORAL | 0 refills | Status: DC

## 2024-01-04 NOTE — Assessment & Plan Note (Signed)
 Diagnosed by psychology January 2025. Is tolerating Adderall, but 15 MG not working as well.  Increase Adderall XR to 30 MG daily, as he recently tried two 15 MG tablets and this worked well.  Educated him on medication and side effects.  Recommend he take early, first thing, in morning for best effect vs later in the day.  We discussed office rules for controlled substances: every 3 month visits, UDS annually, controlled substance agreement.  He is agreeable to these.  UDS due next 08/04/24 and controlled substance agreement signed.  Plan for return in 4 weeks to check dose change and then 3 months for regular follow-up.

## 2024-01-04 NOTE — Progress Notes (Signed)
 There were no vitals taken for this visit.   Subjective:    Patient ID: Eugene Allen, male    DOB: 06-22-2001, 23 y.o.   MRN: [id number]  HPI: Eugene Allen is a 23 y.o. male  Chief Complaint  Patient presents with   ADHD    3 month f/up   Virtual Visit via Video Note  I connected with Kate Mccain on 01/04/24 at  3:40 PM EDT by a video enabled telemedicine application and verified that I am speaking with the correct person using two identifiers.  Location: Patient: home Provider: work   I discussed the limitations of evaluation and management by telemedicine and the availability of in person appointments. The patient expressed understanding and agreed to proceed.  I discussed the assessment and treatment plan with the patient. The patient was provided an opportunity to ask questions and all were answered. The patient agreed with the plan and demonstrated an understanding of the instructions.   The patient was advised to call back or seek an in-person evaluation if the symptoms worsen or if the condition fails to improve as anticipated.  I provided 25 minutes of non-face-to-face time during this encounter.   Melbourne Jakubiak T Jerine Surles, NP  ADHD FOLLOW UP Saw psychology 07/06/23 and 07/28/23, had been following with them - he was diagnosed with adult residual attention deficit disorder.  We increased to Adderall XR 15 MG on 10/03/23 was working good initially, but now feels like it is not working as well.  Took two 15 MG today and feels better and more focused at work. PDMP last fill 12/05/23. ADHD status: as above Controlled substance contract: Up To Date Previous psychiatry evaluation: yes psychology Previous medications: no  Work/school performance:  fair Difficulty sustaining attention/completing tasks: initially worked well, but now having trouble again Distracted by extraneous stimuli:  initially worked well, but now having trouble again Does not listen when spoken to:   initially worked well, but now having trouble again [employer] with hands or feet: yes Unable to stay in seat: no Blurts out/interrupts others: no ADHD Medication Side Effects:     Decreased appetite: yes, mild    Headache: no    Sleeping disturbance pattern: no    Irritability: no    Rebound effects (worse than baseline) off medication: no    Anxiousness: no    Dizziness: no    Tics: no    08/05/2023   10:24 AM 04/06/2022    4:00 PM  Depression screen PHQ 2/9  Decreased Interest 0 1  Down, Depressed, Hopeless 1 1  PHQ - 2 Score 1 2  Altered sleeping 2 2  Tired, decreased energy 2 1  Change in appetite 2 0  Feeling bad or failure about yourself  1 1  Trouble concentrating 2 2  Moving slowly or fidgety/restless 0 0  Suicidal thoughts 0 0  PHQ-9 Score 10 8  Difficult doing work/chores Somewhat difficult Somewhat difficult       08/05/2023   10:24 AM 04/06/2022    4:00 PM  GAD 7 : Generalized Anxiety Score  Nervous, Anxious, on Edge 1 0  Control/stop worrying 1 0  Worry too much - different things 1 3  Trouble relaxing 0 0  Restless 0 0  Easily annoyed or irritable 0 0  Afraid - awful might happen 0 0  Total GAD 7 Score 3 3  Anxiety Difficulty Somewhat difficult Not difficult at all   Relevant past medical, surgical, family and social history reviewed  and updated as indicated. Interim medical history since our last visit reviewed. Allergies and medications reviewed and updated.  Review of Systems  Constitutional:  Negative for activity change, diaphoresis, fatigue and fever.  Respiratory:  Negative for cough, chest tightness, shortness of breath and wheezing.   Cardiovascular:  Negative for chest pain, palpitations and leg swelling.  Gastrointestinal: Negative.   Neurological: Negative.   Psychiatric/Behavioral:  Positive for decreased concentration. Negative for self-injury, sleep disturbance and suicidal ideas. The patient is nervous/anxious.    Per HPI unless  specifically indicated above     Objective:    There were no vitals taken for this visit.  Wt Readings from Last 3 Encounters:  08/05/23 206 lb 6.4 oz (93.6 kg)  04/06/22 188 lb 12.8 oz (85.6 kg)  02/01/21 184 lb (83.5 kg)    Physical Exam Vitals and nursing note reviewed.  Constitutional:      General: He is awake. He is not in acute distress.    Appearance: He is well-developed. He is not ill-appearing or toxic-appearing.  HENT:     Head: Normocephalic.     Right Ear: Hearing normal. No drainage.     Left Ear: Hearing normal. No drainage.  Eyes:     General: Lids are normal.        Right eye: No discharge.        Left eye: No discharge.     Conjunctiva/sclera: Conjunctivae normal.  Pulmonary:     Effort: Pulmonary effort is normal. No accessory muscle usage or respiratory distress.  Musculoskeletal:     Cervical back: Normal range of motion.  Neurological:     Mental Status: He is alert and oriented to person, place, and time.  Psychiatric:        Mood and Affect: Mood normal.        Behavior: Behavior normal. Behavior is cooperative.        Thought Content: Thought content normal.        Judgment: Judgment normal.    Results for orders placed or performed in visit on 08/05/23  [id number] 11+Oxyco+Alc+Crt-Bund   Collection Time: 08/05/23 11:10 AM  Result Value Ref Range   Ethanol Negative Cutoff=0.020 %   Amphetamines, Urine Negative Cutoff=1000 ng/mL   Barbiturate Negative Cutoff=200 ng/mL   BENZODIAZ UR QL Negative Cutoff=200 ng/mL   Cannabinoid Quant, Ur Negative Cutoff=50 ng/mL   Cocaine (Metabolite) Negative Cutoff=300 ng/mL   OPIATE SCREEN URINE Negative Cutoff=300 ng/mL   Oxycodone/Oxymorphone, Urine Negative Cutoff=300 ng/mL   Phencyclidine Negative Cutoff=25 ng/mL   Methadone Screen, Urine Negative Cutoff=300 ng/mL   Propoxyphene Negative Cutoff=300 ng/mL   Meperidine Negative Cutoff=200 ng/mL   Tramadol Negative Cutoff=200 ng/mL   Creatinine 227.5 20.0 -  300.0 mg/dL   pH, Urine 5.6 4.5 - 8.9      Assessment & Plan:   Problem List Items Addressed This Visit       Other   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 Is tolerating Adderall, but 15 MG not working as well.  Increase Adderall XR to 30 MG daily, as he recently tried two 15 MG tablets and this worked well.  Educated him on medication and side effects.  Recommend he take early, first thing, in morning for best effect vs later in the day.  We discussed office rules for controlled substances: every 3 month visits, UDS annually, controlled substance agreement.  He is agreeable to these.  UDS  due next 08/04/24 and controlled substance agreement signed.  Plan for return in 4 weeks to check dose change and then 3 months for regular follow-up.          Follow up plan: Return in about 4 weeks (around 02/01/2024) for ADD.

## 2024-01-05 DIAGNOSIS — Z419 Encounter for procedure for purposes other than remedying health state, unspecified: Secondary | ICD-10-CM | POA: Diagnosis not present

## 2024-01-06 NOTE — Progress Notes (Signed)
 Appoint scheduled

## 2024-02-04 DIAGNOSIS — Z419 Encounter for procedure for purposes other than remedying health state, unspecified: Secondary | ICD-10-CM | POA: Diagnosis not present

## 2024-02-05 NOTE — Patient Instructions (Signed)
 Be Involved in Caring For Your Health:  Taking Medications When medications are taken as directed, they can greatly improve your health. But if they are not taken as prescribed, they may not work. In some cases, not taking them correctly can be harmful. To help ensure your treatment remains effective and safe, understand your medications and how to take them. Bring your medications to each visit for review by your provider.  Your lab results, notes, and after visit summary will be available on My Chart. We strongly encourage you to use this feature. If lab results are abnormal the clinic will contact you with the appropriate steps. If the clinic does not contact you assume the results are satisfactory. You can always view your results on My Chart. If you have questions regarding your health or results, please contact the clinic during office hours. You can also ask questions on My Chart.  We at [facility] are grateful that you chose [place] to provide your care. We strive to provide evidence-based and compassionate care and are always looking for feedback. If you get a survey from the clinic please complete this so we can hear your opinions.  Living With Attention Deficit Hyperactivity Disorder If you have been diagnosed with attention deficit hyperactivity disorder (ADHD), you may be relieved that you now know why you have felt or behaved a certain way. Still, you may feel overwhelmed about the treatment ahead. You may also wonder how to get the support you need and how to deal with the condition day-to-day. With treatment and support, you can live with ADHD and manage your symptoms. How to manage lifestyle changes Managing lifestyle changes can be challenging. Seeking support from your healthcare provider, therapist, family, and friends can be helpful. How to recognize changes in your condition The following signs may mean that your treatment is working well and your condition is  improving: Consistently being on time for appointments. Being more organized at home and work. Other people noticing improvements in your behavior. Achieving goals that you set for yourself. Thinking more clearly. The following signs may mean that your treatment is not working very well: Feeling impatience or more confusion. Missing, forgetting, or being late for appointments. An increasing sense of disorganization and messiness. More difficulty in reaching goals that you set for yourself. Loved ones becoming angry or frustrated with you. Follow these instructions at home: Medicines Take over-the-counter and prescription medicines only as told by your health care provider. Check with your health care provider before taking any new medicines. General instructions Create structure and an organized atmosphere at home. For example: Make a list of tasks, then rank them from most important to least important. Work on one task at a time until your listed tasks are done. Make a daily schedule and follow it consistently every day. Use an appointment calendar, and check it 2-3 times a day to keep on track. Keep it with you when you leave the house. Create spaces where you keep certain things, and always put things back in their places after you use them. Keep all follow-up visits. Your health care provider will need to monitor your condition and adjust your treatment over time. Where to find support Talking to others  Keep emotion out of important discussions and speak in a calm, logical way. Listen closely and patiently to your loved ones. Try to understand their point of view, and try to avoid getting defensive. Take responsibility for the consequences of your actions. Ask that others  do not take your behaviors personally. Aim to solve problems as they come up, and express your feelings instead of bottling them up. Talk openly about what you need from your loved ones and how they can support  you. Consider going to family therapy sessions or having your family meet with a specialist who deals with ADHD-related behavior problems. Finances Not all insurance plans cover mental health care, so it is important to check with your insurance carrier. If paying for co-pays or counseling services is a problem, search for a local or county mental health care center. Public mental health care services may be offered there at a low cost or no cost when you are not able to see a private health care provider. If you are taking medicine for ADHD, you may be able to get the generic form, which may be less expensive than brand-name medicine. Some makers of prescription medicines also offer help to patients who cannot afford the medicines that they need. Therapy and support groups Talking with a mental health care provider and participating in support groups can help to improve your quality of life, daily functioning, and overall symptoms. Questions to ask your health care provider: What are the risks and benefits of taking medicines? Would I benefit from therapy? How often should I follow up with a health care provider? Where to find more information Learn more about ADHD from: Children and Adults with Attention Deficit Hyperactivity Disorder: chadd[employer]: BloggerCourse.com Centers for Disease Control and Prevention: TonerPromos.no Contact a health care provider if: You have side effects from your medicines, such as: Repeated muscle twitches, coughing, or speech outbursts. Sleep problems. Loss of appetite. Dizziness. Unusually fast heartbeat. Stomach pains. Headaches. You have new or worsening behavior problems. You are struggling with anxiety, depression, or substance abuse. Get help right away if: You have a severe reaction to a medicine. These symptoms may be an emergency. Get help right away. Call 911. Do not wait to see if the symptoms will go away. Do not drive  yourself to the hospital. Take one of these steps if you feel like you may hurt yourself or others, or have thoughts about taking your own life: Go to your nearest emergency room. Call 911. Call the National Suicide Prevention Lifeline at 407 159 0130 or 988. This is open 24 hours a day. Text the Crisis Text Line at (901)436-2918. Summary With treatment and support, you can live with ADHD and manage your symptoms. Consider taking part in family therapy or self-help groups with family members or friends. When you talk with friends and family about your ADHD, be patient and communicate openly. Keep all follow-up visits. Your health care provider will need to monitor your condition and adjust your treatment over time. This information is not intended to replace advice given to you by your health care provider. Make sure you discuss any questions you have with your health care provider. Document Revised: 10/30/2021 Document Reviewed: 10/30/2021 Elsevier Patient Education  2024 [employer].

## 2024-02-08 ENCOUNTER — Encounter: Payer: Self-pay | Admitting: Nurse Practitioner

## 2024-02-08 ENCOUNTER — Ambulatory Visit (INDEPENDENT_AMBULATORY_CARE_PROVIDER_SITE_OTHER): Admitting: Nurse Practitioner

## 2024-02-08 VITALS — BP 112/69 | HR 85 | Temp 98.7°F | Ht 67.0 in | Wt 182.6 lb

## 2024-02-08 DIAGNOSIS — Z13220 Encounter for screening for lipoid disorders: Secondary | ICD-10-CM

## 2024-02-08 DIAGNOSIS — E559 Vitamin D deficiency, unspecified: Secondary | ICD-10-CM

## 2024-02-08 DIAGNOSIS — F908 Attention-deficit hyperactivity disorder, other type: Secondary | ICD-10-CM

## 2024-02-08 DIAGNOSIS — Z136 Encounter for screening for cardiovascular disorders: Secondary | ICD-10-CM | POA: Diagnosis not present

## 2024-02-08 MED ORDER — AMPHETAMINE-DEXTROAMPHET ER 30 MG PO CP24: 30.0000 mg | ORAL_CAPSULE | ORAL | 0 refills | Status: DC

## 2024-02-08 NOTE — Assessment & Plan Note (Signed)
 Diagnosed by psychology January 2025. Is tolerating Adderall XR 30 MG and symptoms improving, continue this dose.  Future refills in place.  Educated him on medication and side effects. We discussed office rules for controlled substances: every 3 month visits, UDS annually, controlled substance agreement.  He is agreeable to these.  UDS due next 08/04/24 and controlled substance agreement signed.  Return in 3 months.

## 2024-02-08 NOTE — Progress Notes (Signed)
 BP 112/69   Pulse 85   Temp 98.7 F (37.1 C) (Oral)   Ht 5' 7 (1.702 m)   Wt 182 lb 9.6 oz (82.8 kg)   SpO2 98%   BMI 28.60 kg/m    Subjective:    Patient ID: Eugene Allen, male    DOB: 2001/01/27, 23 y.o.   MRN: [id number]  HPI: Eugene Allen is a 23 y.o. male  Chief Complaint  Patient presents with   ADD   He would like labs in next few weeks to check overall health.  ADHD FOLLOW UP We increased to Adderall XR 30 MG on 01/04/24.  At first was a little rough to fall asleep, over past 2-3 weeks has been sleeping better.  Able to focus better and has mental drive to try to improve things with medications and better sleep pattern. Last fill 02/06/24.  HISTORY: Saw psychology 07/06/23 and 07/28/23, had been following with them - he was diagnosed with adult residual attention deficit disorder.   ADHD status: as above Controlled substance contract: Up To Date Previous psychiatry evaluation: yes psychology Previous medications: no  Work/school performance:  ood Difficulty sustaining attention/completing tasks: no Distracted by extraneous stimuli: no Does not listen when spoken to: no Fidgets with hands or feet: yes Unable to stay in seat: no Blurts out/interrupts others: no ADHD Medication Side Effects:     Decreased appetite: yes, mild    Headache: no    Sleeping disturbance pattern: no    Irritability: no    Rebound effects (worse than baseline) off medication: no    Anxiousness: no    Dizziness: no    Tics: no    02/08/2024    3:55 PM 08/05/2023   10:24 AM 04/06/2022    4:00 PM  Depression screen PHQ 2/9  Decreased Interest 0 0 1  Down, Depressed, Hopeless 1 1 1   PHQ - 2 Score 1 1 2   Altered sleeping 0 2 2  Tired, decreased energy 0 2 1  Change in appetite 0 2 0  Feeling bad or failure about yourself  1 1 1   Trouble concentrating 0 2 2  Moving slowly or fidgety/restless 0 0 0  Suicidal thoughts 0 0 0  PHQ-9 Score 2 10 8   Difficult doing work/chores  Somewhat difficult Somewhat difficult Somewhat difficult       02/08/2024    3:55 PM 08/05/2023   10:24 AM 04/06/2022    4:00 PM  GAD 7 : Generalized Anxiety Score  Nervous, Anxious, on Edge 1 1 0  Control/stop worrying 1 1 0  Worry too much - different things 2 1 3   Trouble relaxing 1 0 0  Restless 0 0 0  Easily annoyed or irritable 0 0 0  Afraid - awful might happen 3 0 0  Total GAD 7 Score 8 3 3   Anxiety Difficulty Somewhat difficult Somewhat difficult Not difficult at all   Relevant past medical, surgical, family and social history reviewed and updated as indicated. Interim medical history since our last visit reviewed. Allergies and medications reviewed and updated.  Review of Systems  Constitutional:  Negative for activity change, diaphoresis, fatigue and fever.  Respiratory:  Negative for cough, chest tightness, shortness of breath and wheezing.   Cardiovascular:  Negative for chest pain, palpitations and leg swelling.  Gastrointestinal: Negative.   Neurological: Negative.   Psychiatric/Behavioral:  Negative for decreased concentration, self-injury, sleep disturbance and suicidal ideas. The patient is nervous/anxious.    Per HPI unless  specifically indicated above     Objective:    BP 112/69   Pulse 85   Temp 98.7 F (37.1 C) (Oral)   Ht 5' 7 (1.702 m)   Wt 182 lb 9.6 oz (82.8 kg)   SpO2 98%   BMI 28.60 kg/m   Wt Readings from Last 3 Encounters:  02/08/24 182 lb 9.6 oz (82.8 kg)  08/05/23 206 lb 6.4 oz (93.6 kg)  04/06/22 188 lb 12.8 oz (85.6 kg)    Physical Exam Vitals and nursing note reviewed.  Constitutional:      General: He is awake. He is not in acute distress.    Appearance: Normal appearance. He is well-developed and well-groomed. He is not ill-appearing or toxic-appearing.  HENT:     Head: Normocephalic.     Right Ear: Hearing and external ear normal.     Left Ear: Hearing and external ear normal.  Eyes:     General: Lids are normal.      Extraocular Movements: Extraocular movements intact.     Conjunctiva/sclera: Conjunctivae normal.  Neck:     Thyroid: No thyromegaly.     Vascular: No carotid bruit.  Cardiovascular:     Rate and Rhythm: Normal rate and regular rhythm.     Heart sounds: Normal heart sounds. No murmur heard.    No gallop.  Pulmonary:     Effort: No accessory muscle usage or respiratory distress.     Breath sounds: Normal breath sounds.  Abdominal:     General: Bowel sounds are normal. There is no distension.     Palpations: Abdomen is soft.     Tenderness: There is no abdominal tenderness.  Musculoskeletal:     Cervical back: Full passive range of motion without pain.     Right lower leg: No edema.     Left lower leg: No edema.  Lymphadenopathy:     Cervical: No cervical adenopathy.  Skin:    General: Skin is warm.     Capillary Refill: Capillary refill takes less than 2 seconds.  Neurological:     Mental Status: He is alert and oriented to person, place, and time.     Deep Tendon Reflexes: Reflexes are normal and symmetric.     Reflex Scores:      Brachioradialis reflexes are 2+ on the right side and 2+ on the left side.      Patellar reflexes are 2+ on the right side and 2+ on the left side. Psychiatric:        Attention and Perception: Attention normal.        Mood and Affect: Mood normal.        Speech: Speech normal.        Behavior: Behavior normal. Behavior is cooperative.        Thought Content: Thought content normal.    Results for orders placed or performed in visit on 08/05/23  [id number] 11+Oxyco+Alc+Crt-Bund   Collection Time: 08/05/23 11:10 AM  Result Value Ref Range   Ethanol Negative Cutoff=0.020 %   Amphetamines, Urine Negative Cutoff=1000 ng/mL   Barbiturate Negative Cutoff=200 ng/mL   BENZODIAZ UR QL Negative Cutoff=200 ng/mL   Cannabinoid Quant, Ur Negative Cutoff=50 ng/mL   Cocaine (Metabolite) Negative Cutoff=300 ng/mL   OPIATE SCREEN URINE Negative Cutoff=300 ng/mL    Oxycodone/Oxymorphone, Urine Negative Cutoff=300 ng/mL   Phencyclidine Negative Cutoff=25 ng/mL   Methadone Screen, Urine Negative Cutoff=300 ng/mL   Propoxyphene Negative Cutoff=300 ng/mL   Meperidine Negative Cutoff=200 ng/mL  Tramadol Negative Cutoff=200 ng/mL   Creatinine 227.5 20.0 - 300.0 mg/dL   pH, Urine 5.6 4.5 - 8.9      Assessment & Plan:   Problem List Items Addressed This Visit       Other   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 Is tolerating Adderall XR 30 MG and symptoms improving, continue this dose.  Future refills in place.  Educated him on medication and side effects. We discussed office rules for controlled substances: every 3 month visits, UDS annually, controlled substance agreement.  He is agreeable to these.  UDS due next 08/04/24 and controlled substance agreement signed.  Return in 3 months.      Relevant Orders   CBC with Differential/Platelet   TSH   Other Visit Diagnoses       Vitamin D deficiency       Check Vitamin D outpatient.   Relevant Orders   VITAMIN D 25 Hydroxy (Vit-D Deficiency, Fractures)     Encounter for lipid screening for cardiovascular disease       Check labs outpatient.   Relevant Orders   Comprehensive metabolic panel with GFR   Lipid Panel w/o Chol/HDL Ratio           Follow up plan: Return in about 3 months (around 05/10/2024) for Annual Physical + needs lab only visit in 3 weeks.

## 2024-02-24 ENCOUNTER — Ambulatory Visit: Admitting: Nurse Practitioner

## 2024-02-29 ENCOUNTER — Other Ambulatory Visit

## 2024-02-29 DIAGNOSIS — E559 Vitamin D deficiency, unspecified: Secondary | ICD-10-CM

## 2024-02-29 DIAGNOSIS — Z13220 Encounter for screening for lipoid disorders: Secondary | ICD-10-CM

## 2024-02-29 DIAGNOSIS — F908 Attention-deficit hyperactivity disorder, other type: Secondary | ICD-10-CM

## 2024-03-01 ENCOUNTER — Ambulatory Visit: Payer: Self-pay | Admitting: Nurse Practitioner

## 2024-03-01 ENCOUNTER — Ambulatory Visit: Admitting: Nurse Practitioner

## 2024-03-01 LAB — COMPREHENSIVE METABOLIC PANEL WITH GFR
ALT: 20 IU/L (ref 0–44)
AST: 23 IU/L (ref 0–40)
Albumin: 4.9 g/dL (ref 4.3–5.2)
Alkaline Phosphatase: 95 IU/L (ref 44–121)
BUN/Creatinine Ratio: 13 (ref 9–20)
BUN: 11 mg/dL (ref 6–20)
Bilirubin Total: 0.5 mg/dL (ref 0.0–1.2)
CO2: 24 mmol/L (ref 20–29)
Calcium: 9.7 mg/dL (ref 8.7–10.2)
Chloride: 100 mmol/L (ref 96–106)
Creatinine, Ser: 0.82 mg/dL (ref 0.76–1.27)
Globulin, Total: 2.4 g/dL (ref 1.5–4.5)
Glucose: 76 mg/dL (ref 70–99)
Potassium: 4.1 mmol/L (ref 3.5–5.2)
Sodium: 139 mmol/L (ref 134–144)
Total Protein: 7.3 g/dL (ref 6.0–8.5)
eGFR: 127 mL/min/1.73 (ref >59)

## 2024-03-01 LAB — CBC WITH DIFFERENTIAL/PLATELET
Basophils Absolute: 0.1 x10E3/uL (ref 0.0–0.2)
Basos: 1 %
EOS (ABSOLUTE): 0.1 x10E3/uL (ref 0.0–0.4)
Eos: 2 %
Hematocrit: 47.1 % (ref 37.5–51.0)
Hemoglobin: 15.4 g/dL (ref 13.0–17.7)
Immature Grans (Abs): 0 x10E3/uL (ref 0.0–0.1)
Immature Granulocytes: 0 %
Lymphocytes Absolute: 1.6 x10E3/uL (ref 0.7–3.1)
Lymphs: 26 %
MCH: 28.8 pg (ref 26.6–33.0)
MCHC: 32.7 g/dL (ref 31.5–35.7)
MCV: 88 fL (ref 79–97)
Monocytes Absolute: 0.5 x10E3/uL (ref 0.1–0.9)
Monocytes: 9 %
Neutrophils Absolute: 3.8 x10E3/uL (ref 1.4–7.0)
Neutrophils: 61 %
Platelets: 322 x10E3/uL (ref 150–450)
RBC: 5.35 x10E6/uL (ref 4.14–5.80)
RDW: 12.8 % (ref 11.6–15.4)
WBC: 6.1 x10E3/uL (ref 3.4–10.8)

## 2024-03-01 LAB — LIPID PANEL W/O CHOL/HDL RATIO
Cholesterol, Total: 158 mg/dL (ref 100–199)
HDL: 38 mg/dL — ABNORMAL LOW (ref >39)
LDL Chol Calc (NIH): 106 mg/dL — ABNORMAL HIGH (ref 0–99)
Triglycerides: 74 mg/dL (ref 0–149)
VLDL Cholesterol Cal: 14 mg/dL (ref 5–40)

## 2024-03-01 LAB — VITAMIN D 25 HYDROXY (VIT D DEFICIENCY, FRACTURES): Vit D, 25-Hydroxy: 32.2 ng/mL (ref 30.0–100.0)

## 2024-03-01 LAB — TSH: TSH: 0.948 u[IU]/mL (ref 0.450–4.500)

## 2024-03-01 NOTE — Progress Notes (Signed)
 Contacted via MyChart  Good morning Seferino, your labs have returned and overall look great with exception of mild elevation in LDL. Your LDL is above normal.  The LDL is the bad cholesterol.  Over time and in combination with inflammation and other factors, this contributes to plaque which in turn may lead to stroke and/or heart attack down the road.  Sometimes high LDL is primarily genetic, and people might be eating all the right foods but still have high numbers.  Other times, there is room for improvement in one's diet and eating healthier can bring this number down and potentially reduce one's risk of heart attack and/or stroke. To reduce your LDL, Remember - more fruits and vegetables, more fish, and limit red meat and dairy products.  More soy, nuts, beans, barley, lentils, oats and plant sterol ester enriched margarine instead of butter.  I also encourage eliminating sugar and processed food.  Any questions? Keep being stellar!!  Thank you for allowing me to participate in your care.  I appreciate you. Kindest regards, Francetta Ilg

## 2024-03-06 DIAGNOSIS — Z419 Encounter for procedure for purposes other than remedying health state, unspecified: Secondary | ICD-10-CM | POA: Diagnosis not present

## 2024-04-06 DIAGNOSIS — Z419 Encounter for procedure for purposes other than remedying health state, unspecified: Secondary | ICD-10-CM | POA: Diagnosis not present

## 2024-04-15 NOTE — Patient Instructions (Incomplete)
 Living With Attention Deficit Hyperactivity Disorder If you have been diagnosed with attention deficit hyperactivity disorder (ADHD), you may be relieved that you now know why you have felt or behaved a certain way. Still, you may feel overwhelmed about the treatment ahead. You may also wonder how to get the support you need and how to deal with the condition day-to-day. With treatment and support, you can live with ADHD and manage your symptoms. How to manage lifestyle changes Managing lifestyle changes can be challenging. Seeking support from your healthcare provider, therapist, family, and friends can be helpful. How to recognize changes in your condition The following signs may mean that your treatment is working well and your condition is improving: Consistently being on time for appointments. Being more organized at home and work. Other people noticing improvements in your behavior. Achieving goals that you set for yourself. Thinking more clearly. The following signs may mean that your treatment is not working very well: Feeling impatience or more confusion. Missing, forgetting, or being late for appointments. An increasing sense of disorganization and messiness. More difficulty in reaching goals that you set for yourself. Loved ones becoming angry or frustrated with you. Follow these instructions at home: Medicines Take over-the-counter and prescription medicines only as told by your health care provider. Check with your health care provider before taking any new medicines. General instructions Create structure and an organized atmosphere at home. For example: Make a list of tasks, then rank them from most important to least important. Work on one task at a time until your listed tasks are done. Make a daily schedule and follow it consistently every day. Use an appointment calendar, and check it 2-3 times a day to keep on track. Keep it with you when you leave the house. Create  spaces where you keep certain things, and always put things back in their places after you use them. Keep all follow-up visits. Your health care provider will need to monitor your condition and adjust your treatment over time. Where to find support Talking to others  Keep emotion out of important discussions and speak in a calm, logical way. Listen closely and patiently to your loved ones. Try to understand their point of view, and try to avoid getting defensive. Take responsibility for the consequences of your actions. Ask that others do not take your behaviors personally. Aim to solve problems as they come up, and express your feelings instead of bottling them up. Talk openly about what you need from your loved ones and how they can support you. Consider going to family therapy sessions or having your family meet with a specialist who deals with ADHD-related behavior problems. Finances Not all insurance plans cover mental health care, so it is important to check with your insurance carrier. If paying for co-pays or counseling services is a problem, search for a local or county mental health care center. Public mental health care services may be offered there at a low cost or no cost when you are not able to see a private health care provider. If you are taking medicine for ADHD, you may be able to get the generic form, which may be less expensive than brand-name medicine. Some makers of prescription medicines also offer help to patients who cannot afford the medicines that they need. Therapy and support groups Talking with a mental health care provider and participating in support groups can help to improve your quality of life, daily functioning, and overall symptoms. Questions to ask your health  care provider: What are the risks and benefits of taking medicines? Would I benefit from therapy? How often should I follow up with a health care provider? Where to find more information Learn more  about ADHD from: Children and Adults with Attention Deficit Hyperactivity Disorder: chadd[employer]: BloggerCourse.com Centers for Disease Control and Prevention: TonerPromos.no Contact a health care provider if: You have side effects from your medicines, such as: Repeated muscle twitches, coughing, or speech outbursts. Sleep problems. Loss of appetite. Dizziness. Unusually fast heartbeat. Stomach pains. Headaches. You have new or worsening behavior problems. You are struggling with anxiety, depression, or substance abuse. Get help right away if: You have a severe reaction to a medicine. These symptoms may be an emergency. Get help right away. Call 911. Do not wait to see if the symptoms will go away. Do not drive yourself to the hospital. Take one of these steps if you feel like you may hurt yourself or others, or have thoughts about taking your own life: Go to your nearest emergency room. Call 911. Call the National Suicide Prevention Lifeline at 662 550 4067 or 988. This is open 24 hours a day. Text the Crisis Text Line at 430-382-2444. Summary With treatment and support, you can live with ADHD and manage your symptoms. Consider taking part in family therapy or self-help groups with family members or friends. When you talk with friends and family about your ADHD, be patient and communicate openly. Keep all follow-up visits. Your health care provider will need to monitor your condition and adjust your treatment over time. This information is not intended to replace advice given to you by your health care provider. Make sure you discuss any questions you have with your health care provider. Document Revised: 10/30/2021 Document Reviewed: 10/30/2021 Elsevier Patient Education  2024 [employer].

## 2024-04-18 ENCOUNTER — Ambulatory Visit: Admitting: Nurse Practitioner

## 2024-05-11 ENCOUNTER — Other Ambulatory Visit: Payer: Self-pay | Admitting: Nurse Practitioner

## 2024-05-11 NOTE — Telephone Encounter (Signed)
 Copied from CRM #[id number]. Topic: Clinical - Medication Refill >> May 11, 2024  4:37 PM Delon HERO wrote: Medication: amphetamine -dextroamphetamine (ADDERALL XR)30 MG 24 hr capsule [[id number]]  Patient requesting medication to be sent to the below pharmacy - as to the previous pharmacy medication was sent to is out of stock.   Has the patient contacted their pharmacy? Yes (Agent: If no, request that the patient contact the pharmacy for the refill. If patient does not wish to contact the pharmacy document the reason why and proceed with request.) (Agent: If yes, when and what did the pharmacy advise?)  This is the patient's preferred pharmacy: [facility] [id number] - [place], [place] - [address] [[id number]]   Is this the correct pharmacy for this prescription? Yes If no, delete pharmacy and type the correct one.   Has the prescription been filled recently? Yes  Is the patient out of the medication? Yes  Has the patient been seen for an appointment in the last year OR does the patient have an upcoming appointment? Yes  Can we respond through MyChart? Yes  Agent: Please be advised that Rx refills may take up to 3 business days. We ask that you follow-up with your pharmacy.

## 2024-05-14 NOTE — Telephone Encounter (Signed)
 Requested medication (s) are due for refill today: yes  Requested medication (s) are on the active medication list: yes  Last refill:  02/08/24  Future visit scheduled: yes  Notes to clinic:  Unable to refill per protocol, cannot delegate.      Requested Prescriptions  Pending Prescriptions Disp Refills   amphetamine -dextroamphetamine (ADDERALL XR) 30 MG 24 hr capsule 30 capsule 0    Sig: Take 1 capsule (30 mg total) by mouth every morning.     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

## 2024-05-15 ENCOUNTER — Ambulatory Visit: Admitting: Nurse Practitioner

## 2024-05-15 VITALS — BP 118/74 | HR 78 | Temp 98.4°F | Resp 14 | Ht 67.01 in | Wt 172.0 lb

## 2024-05-15 DIAGNOSIS — Z136 Encounter for screening for cardiovascular disorders: Secondary | ICD-10-CM | POA: Diagnosis not present

## 2024-05-15 DIAGNOSIS — Z0000 Encounter for general adult medical examination without abnormal findings: Secondary | ICD-10-CM

## 2024-05-15 DIAGNOSIS — F908 Attention-deficit hyperactivity disorder, other type: Secondary | ICD-10-CM

## 2024-05-15 DIAGNOSIS — Z13220 Encounter for screening for lipoid disorders: Secondary | ICD-10-CM | POA: Diagnosis not present

## 2024-05-15 DIAGNOSIS — F6389 Other impulse disorders: Secondary | ICD-10-CM | POA: Diagnosis not present

## 2024-05-15 MED ORDER — AMPHETAMINE-DEXTROAMPHET ER 30 MG PO CP24: 30.0000 mg | ORAL_CAPSULE | ORAL | 0 refills | Status: AC

## 2024-05-15 NOTE — Assessment & Plan Note (Addendum)
 Diagnosed by psychology January 2025. Is tolerating Adderall XR 30 MG, continue this dose as symptoms improved.  Future refills in place.  Educated him on medication and side effects. We discussed office rules for controlled substances: every 3 month visits, UDS annually, controlled substance agreement.  He is agreeable to these.  UDS due next 08/04/24 and controlled substance agreement signed.  Return in 3 months.

## 2024-05-15 NOTE — Patient Instructions (Signed)

## 2024-05-15 NOTE — Progress Notes (Signed)
 BP 118/74 (BP Location: Left Arm, Patient Position: Sitting, Cuff Size: Normal)   Pulse 78   Temp 98.4 F (36.9 C) (Oral)   Resp 14   Ht 5' 7.01 (1.702 m)   Wt 172 lb (78 kg)   SpO2 99%   BMI 26.93 kg/m    Subjective:    Patient ID: Eugene Allen, male    DOB: 05/05/2001, 23 y.o.   MRN: [id number]  HPI: Eugene Allen is a 23 y.o. male presenting on 05/15/2024 for comprehensive medical examination. Current medical complaints include:none  He currently lives with: self Interim Problems from his last visit: no  ADHD FOLLOW UP Taking Adderall XR 30 MG every morning. Last refill 04/07/24. Has missed it for a week due to being out of stock. Takes in morning around 11 and this does offer benefit, he noticed a difference without it. 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   HISTORY: Saw psychology 07/06/23 and 07/28/23, had been following with them - he was diagnosed with adult residual attention deficit disorder.   ADHD status: as above Controlled substance contract: Up To Date Previous psychiatry evaluation: yes psychology Previous medications: no  Work/school performance:  good Difficulty sustaining attention/completing tasks: no Distracted by extraneous stimuli: no Does not listen when spoken to: no Fidgets with hands or feet: yes Unable to stay in seat: no Blurts out/interrupts others: no ADHD Medication Side Effects:     Decreased appetite: yes, mild    Headache: no    Sleeping disturbance pattern: no    Irritability: no    Rebound effects (worse than baseline) off medication: no    Anxiousness: no    Dizziness: no    Tics: no   FALL RISK:    05/15/2024    3:40 PM 02/08/2024    3:54 PM 08/05/2023   10:23 AM 04/06/2022    4:00 PM  Fall Risk   Falls in the past year? 0 0 0 0  Number falls in past yr: 0 0 0 0  Injury with Fall? 0 0 0 0  Risk for fall due to : No Fall Risks No Fall Risks No Fall Risks No Fall Risks  Follow up Falls  evaluation completed Falls evaluation completed Falls evaluation completed Falls evaluation completed      Data saved with a previous flowsheet row definition   Depression Screen    05/15/2024    3:40 PM 02/08/2024    3:55 PM 08/05/2023   10:24 AM 04/06/2022    4:00 PM  Depression screen PHQ 2/9  Decreased Interest 2 0 0 1  Down, Depressed, Hopeless 1 1 1 1   PHQ - 2 Score 3 1 1 2   Altered sleeping 3 0 2 2  Tired, decreased energy 2 0 2 1  Change in appetite 1 0 2 0  Feeling bad or failure about yourself  3 1 1 1   Trouble concentrating 3 0 2 2  Moving slowly or fidgety/restless 0 0 0 0  Suicidal thoughts 0 0 0 0  PHQ-9 Score 15 2 10 8   Difficult doing work/chores Very difficult Somewhat difficult Somewhat difficult Somewhat difficult      05/15/2024    3:40 PM 02/08/2024    3:55 PM 08/05/2023   10:24 AM 04/06/2022    4:00 PM  GAD 7 : Generalized Anxiety Score  Nervous, Anxious, on Edge 0 1 1 0  Control/stop worrying 1 1 1  0  Worry too much - different things  1 2 1 3   Trouble relaxing 1 1 0 0  Restless 0 0 0 0  Easily annoyed or irritable 0 0 0 0  Afraid - awful might happen 0 3 0 0  Total GAD 7 Score 3 8 3 3   Anxiety Difficulty Not difficult at all Somewhat difficult Somewhat difficult Not difficult at all   Past Medical History:  Past Medical History:  Diagnosis Date   ADHD    Allergies    Anxiety     Surgical History:  Past Surgical History:  Procedure Laterality Date   APPENDECTOMY      Medications:  Current Outpatient Medications on File Prior to Visit  Medication Sig   amphetamine -dextroamphetamine (ADDERALL XR) 30 MG 24 hr capsule Take 1 capsule (30 mg total) by mouth every morning.   No current facility-administered medications on file prior to visit.   Allergies:  No Known Allergies  Social History:  Social History   Socioeconomic History   Marital status: Single    Spouse name: Not on file   Number of children: Not on file   Years of  education: Not on file   Highest education level: Some college, no degree  Occupational History   Not on file  Tobacco Use   Smoking status: Never    Passive exposure: Never   Smokeless tobacco: Never  Vaping Use   Vaping status: Never Used  Substance and Sexual Activity   Alcohol use: Yes    Comment: occasional   Drug use: Never   Sexual activity: Not Currently  Other Topics Concern   Not on file  Social History Narrative   Not on file   Social Drivers of Health   Financial Resource Strain: Low Risk  (05/15/2024)   Overall Financial Resource Strain (CARDIA)    Difficulty of Paying Living Expenses: Not hard at all  Food Insecurity: No Food Insecurity (05/15/2024)   Hunger Vital Sign    Worried About Running Out of Food in the Last Year: Never true    Ran Out of Food in the Last Year: Never true  Transportation Needs: No Transportation Needs (05/15/2024)   PRAPARE - Administrator, Civil Service (Medical): No    Lack of Transportation (Non-Medical): No  Physical Activity: Insufficiently Active (05/15/2024)   Exercise Vital Sign    Days of Exercise per Week: 4 days    Minutes of Exercise per Session: 30 min  Stress: Stress Concern Present (05/15/2024)   [employer] of Occupational Health - Occupational Stress Questionnaire    Feeling of Stress: Rather much  Social Connections: Moderately Integrated (05/15/2024)   Social Connection and Isolation Panel    Frequency of Communication with Friends and Family: More than three times a week    Frequency of Social Gatherings with Friends and Family: More than three times a week    Attends Religious Services: 1 to 4 times per year    Active Member of [employer] or Organizations: Yes    Attends Banker Meetings: More than 4 times per year    Marital Status: Never married  Intimate Partner Violence: Not At Risk (04/06/2022)   Humiliation, Afraid, Rape, and Kick questionnaire    Fear of Current or  Ex-Partner: No    Emotionally Abused: No    Physically Abused: No    Sexually Abused: No   Social History   Tobacco Use  Smoking Status Never   Passive exposure: Never  Smokeless Tobacco Never   Social  History   Substance and Sexual Activity  Alcohol Use Yes   Comment: occasional    Family History:  Family History  Problem Relation Age of Onset   Hypertension Mother    Breast cancer Mother    Cancer Mother    Healthy Father     Past medical history, surgical history, medications, allergies, family history and social history reviewed with patient today and changes made to appropriate areas of the chart.   ROS All other ROS negative except what is listed above and in the HPI.      Objective:    BP 118/74 (BP Location: Left Arm, Patient Position: Sitting, Cuff Size: Normal)   Pulse 78   Temp 98.4 F (36.9 C) (Oral)   Resp 14   Ht 5' 7.01 (1.702 m)   Wt 172 lb (78 kg)   SpO2 99%   BMI 26.93 kg/m   Wt Readings from Last 3 Encounters:  05/15/24 172 lb (78 kg)  02/08/24 182 lb 9.6 oz (82.8 kg)  08/05/23 206 lb 6.4 oz (93.6 kg)    Physical Exam Vitals and nursing note reviewed.  Constitutional:      General: He is awake. He is not in acute distress.    Appearance: He is well-developed and well-groomed. He is not ill-appearing or toxic-appearing.  HENT:     Head: Normocephalic and atraumatic.     Right Ear: Hearing, tympanic membrane, ear canal and external ear normal. No drainage.     Left Ear: Hearing, tympanic membrane, ear canal and external ear normal. No drainage.     Nose: Nose normal.     Mouth/Throat:     Pharynx: Uvula midline.  Eyes:     General: Lids are normal.        Right eye: No discharge.        Left eye: No discharge.     Extraocular Movements: Extraocular movements intact.     Conjunctiva/sclera: Conjunctivae normal.     Pupils: Pupils are equal, round, and reactive to light.     Visual Fields: Right eye visual fields normal and left  eye visual fields normal.  Neck:     Thyroid: No thyromegaly.     Vascular: No carotid bruit or JVD.     Trachea: Trachea normal.  Cardiovascular:     Rate and Rhythm: Normal rate and regular rhythm.     Heart sounds: Normal heart sounds, S1 normal and S2 normal. No murmur heard.    No gallop.  Pulmonary:     Effort: Pulmonary effort is normal. No accessory muscle usage or respiratory distress.     Breath sounds: Normal breath sounds.  Abdominal:     General: Bowel sounds are normal.     Palpations: Abdomen is soft. There is no hepatomegaly or splenomegaly.     Tenderness: There is no abdominal tenderness.  Musculoskeletal:        General: Normal range of motion.     Cervical back: Normal range of motion and neck supple.     Right lower leg: No edema.     Left lower leg: No edema.  Lymphadenopathy:     Head:     Right side of head: No submental, submandibular, tonsillar, preauricular or posterior auricular adenopathy.     Left side of head: No submental, submandibular, tonsillar, preauricular or posterior auricular adenopathy.     Cervical: No cervical adenopathy.  Skin:    General: Skin is warm and dry.  Capillary Refill: Capillary refill takes less than 2 seconds.     Findings: No rash.  Neurological:     Mental Status: He is alert and oriented to person, place, and time.     Gait: Gait is intact.     Deep Tendon Reflexes: Reflexes are normal and symmetric.     Reflex Scores:      Brachioradialis reflexes are 2+ on the right side and 2+ on the left side.      Patellar reflexes are 2+ on the right side and 2+ on the left side. Psychiatric:        Attention and Perception: Attention normal.        Mood and Affect: Mood normal.        Speech: Speech normal.        Behavior: Behavior normal. Behavior is cooperative.        Thought Content: Thought content normal.        Cognition and Memory: Cognition normal.      Results for orders placed or performed in visit on  02/29/24  VITAMIN D  25 Hydroxy (Vit-D Deficiency, Fractures)   Collection Time: 02/29/24  3:44 PM  Result Value Ref Range   Vit D, 25-Hydroxy 32.2 30.0 - 100.0 ng/mL  TSH   Collection Time: 02/29/24  3:44 PM  Result Value Ref Range   TSH 0.948 0.450 - 4.500 uIU/mL  Lipid Panel w/o Chol/HDL Ratio   Collection Time: 02/29/24  3:44 PM  Result Value Ref Range   Cholesterol, Total 158 100 - 199 mg/dL   Triglycerides 74 0 - 149 mg/dL   HDL 38 (L) >60 mg/dL   VLDL Cholesterol Cal 14 5 - 40 mg/dL   LDL Chol Calc (NIH) 893 (H) 0 - 99 mg/dL  CBC with Differential/Platelet   Collection Time: 02/29/24  3:44 PM  Result Value Ref Range   WBC 6.1 3.4 - 10.8 x10E3/uL   RBC 5.35 4.14 - 5.80 x10E6/uL   Hemoglobin 15.4 13.0 - 17.7 g/dL   Hematocrit 52.8 62.4 - 51.0 %   MCV 88 79 - 97 fL   MCH 28.8 26.6 - 33.0 pg   MCHC 32.7 31.5 - 35.7 g/dL   RDW 87.1 88.3 - 84.5 %   Platelets 322 150 - 450 x10E3/uL   Neutrophils 61 Not Estab. %   Lymphs 26 Not Estab. %   Monocytes 9 Not Estab. %   Eos 2 Not Estab. %   Basos 1 Not Estab. %   Neutrophils Absolute 3.8 1.4 - 7.0 x10E3/uL   Lymphocytes Absolute 1.6 0.7 - 3.1 x10E3/uL   Monocytes Absolute 0.5 0.1 - 0.9 x10E3/uL   EOS (ABSOLUTE) 0.1 0.0 - 0.4 x10E3/uL   Basophils Absolute 0.1 0.0 - 0.2 x10E3/uL   Immature Granulocytes 0 Not Estab. %   Immature Grans (Abs) 0.0 0.0 - 0.1 x10E3/uL  Comprehensive metabolic panel with GFR   Collection Time: 02/29/24  3:44 PM  Result Value Ref Range   Glucose 76 70 - 99 mg/dL   BUN 11 6 - 20 mg/dL   Creatinine, Ser 9.17 0.76 - 1.27 mg/dL   eGFR 872 >40 fO/fpw/8.26   BUN/Creatinine Ratio 13 9 - 20   Sodium 139 134 - 144 mmol/L   Potassium 4.1 3.5 - 5.2 mmol/L   Chloride 100 96 - 106 mmol/L   CO2 24 20 - 29 mmol/L   Calcium 9.7 8.7 - 10.2 mg/dL   Total Protein 7.3 6.0 - 8.5 g/dL  Albumin 4.9 4.3 - 5.2 g/dL   Globulin, Total 2.4 1.5 - 4.5 g/dL   Bilirubin Total 0.5 0.0 - 1.2 mg/dL   Alkaline Phosphatase 95  44 - 121 IU/L   AST 23 0 - 40 IU/L   ALT 20 0 - 44 IU/L      Assessment & Plan:   Problem List Items Addressed This Visit       Other   Internet addiction   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 Is tolerating Adderall XR 30 MG, continue this dose as symptoms improved.  Future refills in place.  Educated him on medication and side effects. We discussed office rules for controlled substances: every 3 month visits, UDS annually, controlled substance agreement.  He is agreeable to these.  UDS due next 08/04/24 and controlled substance agreement signed.  Return in 3 months.      Other Visit Diagnoses       Encounter for lipid screening for cardiovascular disease       Lipid panel today.   Relevant Orders   Comprehensive metabolic panel with GFR   Lipid Panel w/o Chol/HDL Ratio     Encounter for annual physical exam       Annual physical with labs today.   Relevant Orders   CBC with Differential/Platelet   TSH       Discussed aspirin prophylaxis for myocardial infarction prevention and decision was it was not indicated  LABORATORY TESTING:  Health maintenance labs ordered today as discussed above.   IMMUNIZATIONS:   - Tdap: Tetanus vaccination status reviewed: refused -- thinks had within past 10 years - Influenza: Refused - Pneumovax: Not applicable - Prevnar: Not applicable - Zostavax vaccine: Not applicable  SCREENING: - Colonoscopy: Not applicable  Discussed with patient purpose of the colonoscopy is to detect colon cancer at curable precancerous or early stages   - AAA Screening: Not applicable  -Hearing Test: Not applicable  -Spirometry: Not applicable   PATIENT COUNSELING:    Sexuality: Discussed sexually transmitted diseases,  partner selection, use of condoms, avoidance of unintended pregnancy  and contraceptive alternatives.   Advised to avoid cigarette smoking.  I discussed with the patient that most people either abstain from alcohol or drink within safe limits (<=14/week and <=4 drinks/occasion for males, <=7/weeks and <= 3 drinks/occasion for females) and that the risk for alcohol disorders and other health effects rises proportionally with the number of drinks per week and how often a drinker exceeds daily limits.  Discussed cessation/primary prevention of drug use and availability of treatment for abuse.   Diet: Encouraged to adjust caloric intake to maintain  or achieve ideal body weight, to reduce intake of dietary saturated fat and total fat, to limit sodium intake by avoiding high sodium foods and not adding table salt, and to maintain adequate dietary potassium and calcium preferably from fresh fruits, vegetables, and low-fat dairy products.    Stressed the importance of regular exercise  Injury prevention: Discussed safety belts, safety helmets, smoke detector, smoking near bedding or upholstery.   Dental health: Discussed importance of regular tooth brushing, flossing, and dental visits.   Follow up plan: NEXT PREVENTATIVE PHYSICAL DUE IN 1 YEAR. Return in about 3 months (around 08/15/2024) for ADD -- needs  UDS.

## 2024-05-15 NOTE — Assessment & Plan Note (Signed)
 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

## 2024-05-16 ENCOUNTER — Ambulatory Visit: Payer: Self-pay | Admitting: Nurse Practitioner

## 2024-05-16 LAB — COMPREHENSIVE METABOLIC PANEL WITH GFR
ALT: 32 IU/L (ref 0–44)
AST: 32 IU/L (ref 0–40)
Albumin: 5 g/dL (ref 4.3–5.2)
Alkaline Phosphatase: 90 IU/L (ref 47–123)
BUN/Creatinine Ratio: 17 (ref 9–20)
BUN: 12 mg/dL (ref 6–20)
Bilirubin Total: 0.3 mg/dL (ref 0.0–1.2)
CO2: 24 mmol/L (ref 20–29)
Calcium: 9.8 mg/dL (ref 8.7–10.2)
Chloride: 99 mmol/L (ref 96–106)
Creatinine, Ser: 0.72 mg/dL — ABNORMAL LOW (ref 0.76–1.27)
Globulin, Total: 2.1 g/dL (ref 1.5–4.5)
Glucose: 87 mg/dL (ref 70–99)
Potassium: 4.2 mmol/L (ref 3.5–5.2)
Sodium: 139 mmol/L (ref 134–144)
Total Protein: 7.1 g/dL (ref 6.0–8.5)
eGFR: 132 mL/min/1.73 (ref >59)

## 2024-05-16 LAB — LIPID PANEL W/O CHOL/HDL RATIO
Cholesterol, Total: 184 mg/dL (ref 100–199)
HDL: 42 mg/dL (ref >39)
LDL Chol Calc (NIH): 91 mg/dL (ref 0–99)
Triglycerides: 305 mg/dL — ABNORMAL HIGH (ref 0–149)
VLDL Cholesterol Cal: 51 mg/dL — ABNORMAL HIGH (ref 5–40)

## 2024-05-16 LAB — CBC WITH DIFFERENTIAL/PLATELET
Basophils Absolute: 0.1 x10E3/uL (ref 0.0–0.2)
Basos: 1 %
EOS (ABSOLUTE): 0.2 x10E3/uL (ref 0.0–0.4)
Eos: 2 %
Hematocrit: 46.3 % (ref 37.5–51.0)
Hemoglobin: 15.3 g/dL (ref 13.0–17.7)
Immature Grans (Abs): 0.4 x10E3/uL — ABNORMAL HIGH (ref 0.0–0.1)
Immature Granulocytes: 4 %
Lymphocytes Absolute: 3.1 x10E3/uL (ref 0.7–3.1)
Lymphs: 29 %
MCH: 29.3 pg (ref 26.6–33.0)
MCHC: 33 g/dL (ref 31.5–35.7)
MCV: 89 fL (ref 79–97)
Monocytes Absolute: 0.8 x10E3/uL (ref 0.1–0.9)
Monocytes: 7 %
Neutrophils Absolute: 6.1 x10E3/uL (ref 1.4–7.0)
Neutrophils: 57 %
Platelets: 432 x10E3/uL (ref 150–450)
RBC: 5.22 x10E6/uL (ref 4.14–5.80)
RDW: 12 % (ref 11.6–15.4)
WBC: 10.7 x10E3/uL (ref 3.4–10.8)

## 2024-05-16 LAB — TSH: TSH: 1.47 u[IU]/mL (ref 0.450–4.500)

## 2024-05-16 NOTE — Progress Notes (Signed)
 Contacted via MyChart  Good morning Eugene Allen, your labs have returned and overall remain stable with no significant findings. Triglycerides a little elevated, but if not fasting it would explain that.  Focus heavily on healthy diet and regular activity.  Any questions? Keep being awesome!!  Thank you for allowing me to participate in your care.  I appreciate you. Kindest regards, Gracielynn Birkel

## 2024-07-06 DIAGNOSIS — Z419 Encounter for procedure for purposes other than remedying health state, unspecified: Secondary | ICD-10-CM | POA: Diagnosis not present

## 2024-08-01 ENCOUNTER — Encounter: Payer: Self-pay | Admitting: Nurse Practitioner

## 2024-08-11 NOTE — Patient Instructions (Incomplete)
Be Involved in Caring For Your Health:  Taking Medications When medications are taken as directed, they can greatly improve your health. But if they are not taken as prescribed, they may not work. In some cases, not taking them correctly can be harmful. To help ensure your treatment remains effective and safe, understand your medications and how to take them. Bring your medications to each visit for review by your provider.  Your lab results, notes, and after visit summary will be available on My Chart. We strongly encourage you to use this feature. If lab results are abnormal the clinic will contact you with the appropriate steps. If the clinic does not contact you assume the results are satisfactory. You can always view your results on My Chart. If you have questions regarding your health or results, please contact the clinic during office hours. You can also ask questions on My Chart.  We at [facility] are grateful that you chose [place] to provide your care. We strive to provide evidence-based and compassionate care and are always looking for feedback. If you get a survey from the clinic please complete this so we can hear your opinions.  Attention Deficit Hyperactivity Disorder, Adult Attention deficit hyperactivity disorder (ADHD) is a mental health disorder that starts during childhood. For many people with ADHD, the disorder continues into the adult years. Treatment can help you manage your symptoms. There are three main types of ADHD: Inattentive. With this type, adults have difficulty paying attention. This may affect cognitive abilities. Hyperactive-impulsive. With this type, adults have a lot of energy and have difficulty controlling their behavior. Combination type. Some people may have symptoms of both types. What are the causes? The exact cause of ADHD is not known. Most experts believe a person's genes and environment possibly contribute to ADHD. What increases the  risk? The following factors may make you more likely to develop this condition: Having a first-degree relative such as a parent, brother, or sister, with the condition. Being born before 37 weeks of pregnancy (prematurely) or at a low birth weight. Being born to a mother who smoked tobacco or drank alcohol during pregnancy. Having experienced a brain injury. Being exposed to lead or other toxins in the womb or early in life. What are the signs or symptoms? Symptoms of this condition depend on the type of ADHD. Symptoms of the inattentive type include: Difficulty paying attention or following instructions. Often making simple mistakes. Being disorganized. Avoiding tasks that require time and attention. Losing and forgetting things. Symptoms of the hyperactive-impulsive type include: Restlessness. Talking out of turn, interrupting others, or talking too much. Difficulty with: Sitting still. Feeling motivated. Relaxing. Waiting in line or waiting for a turn. People with the combination type have symptoms of both of the other types. In adults, this condition may lead to certain problems, such as: Keeping jobs. Performing tasks at work. Having stable relationships. Being on time or keeping to a schedule. How is this diagnosed? This condition is diagnosed based on your current symptoms and your history of symptoms. The diagnosis can be made by a health care provider such as a primary care provider or a mental health care specialist. Your health care provider may use a symptom checklist or a behavior rating scale to evaluate your symptoms. Your health care provider may also want to talk with people who have observed your behaviors throughout your life. How is this treated? This condition can be treated with medicines and behavior therapy. Medicines may be  the best option to reduce impulsive behaviors and improve attention. Your health care provider may recommend: Stimulant medicines. These  are the most common medicines used for adult ADHD. They affect certain chemicals in the brain (neurotransmitters) and improve your ability to control your symptoms. A non-stimulant medicine. These medicines can also improve focus, attention, and impulsive behavior. It may take weeks to months to see the effects of this medicine. Counseling and behavioral management are also important for treating ADHD. Counseling is often used along with medicine. Your health care provider may suggest: Cognitive behavioral therapy (CBT). This type of therapy teaches you to replace negative thoughts and actions with positive thoughts and actions. When used as part of ADHD treatment, this therapy may also include: Coping strategies for organization, time management, impulse control, and stress reduction. Mindfulness and meditation training. Behavioral management. You may work with a Psychologist, occupational who is specially trained to help people with ADHD manage and organize activities and function more effectively. Follow these instructions at home: Medicines  Take over-the-counter and prescription medicines only as told by your health care provider. Talk with your health care provider about the possible side effects of your medicines and how to manage them. Alcohol use Do not drink alcohol if: Your health care provider tells you not to drink. You are pregnant, may be pregnant, or are planning to become pregnant. If you drink alcohol: Limit how much you use to: 0-1 drink a day for women. 0-2 drinks a day for men. Know how much alcohol is in your drink. In the [place] one drink equals one 12 oz bottle of beer (355 mL), one 5 oz glass of wine (148 mL), or one 1 oz glass of hard liquor (44 mL). Lifestyle  Do not use illegal drugs. Get enough sleep. Eat a healthy diet. Exercise regularly. Exercise can help to reduce stress and anxiety. General instructions Learn as much as you can about adult ADHD, and work closely with your  health care providers to find the treatments that work best for you. Follow the same schedule each day. Use reminder devices like notes, calendars, and phone apps to stay on time and organized. Keep all follow-up visits. Your health care provider will need to monitor your condition and adjust your treatment over time. Where to find more information A health care provider may be able to recommend resources that are available online or over the phone. You could start with: Attention Deficit Disorder Association (ADDA): [email or web address] [facility]): [email or web address] Contact a health care provider if: Your symptoms continue to cause problems. You have side effects from your medicine, such as: Repeated muscle twitches, coughing, or speech outbursts. Sleep problems. Loss of appetite. Dizziness. Unusually fast heartbeat. Stomach pains. Headaches. You are struggling with anxiety, depression, or substance abuse. Get help right away if: You have a severe reaction to a medicine. This symptom may be an emergency. Get help right away. Call 911. Do not wait to see if the symptom will go away. Do not drive yourself to the hospital. Take one of these steps if you feel like you may hurt yourself or others, or have thoughts about taking your own life: Go to your nearest emergency room. Call 911. Call the National Suicide Prevention Lifeline at 862-475-4951 or 988. This is open 24 hours a day Text the Crisis Text Line at (361)343-8122. Summary ADHD is a mental health disorder that starts during childhood and often continues into your adult years. The exact cause of ADHD  is not known. Most experts believe genetics and environmental factors contribute to ADHD. There is no cure for ADHD, but treatment with medicine, cognitive behavioral therapy, or behavioral management can help you manage your condition. This information is not intended to replace advice given to you by your health care  provider. Make sure you discuss any questions you have with your health care provider. Document Revised: 10/30/2021 Document Reviewed: 10/30/2021 [employer] Patient Education  2024 [employer].

## 2024-08-14 ENCOUNTER — Ambulatory Visit: Admitting: Nurse Practitioner

## 2024-08-14 DIAGNOSIS — F908 Attention-deficit hyperactivity disorder, other type: Secondary | ICD-10-CM

## 2024-08-14 DIAGNOSIS — Z79899 Other long term (current) drug therapy: Secondary | ICD-10-CM

## 2024-08-17 ENCOUNTER — Ambulatory Visit: Admitting: Nurse Practitioner
